# Patient Record
Sex: Female | Born: 1985
Health system: Southern US, Community
[De-identification: ages and names within clinical notes are randomized; demographics above are authoritative.]

## PROBLEM LIST (undated history)

## (undated) DIAGNOSIS — E039 Hypothyroidism, unspecified: Secondary | ICD-10-CM

## (undated) DIAGNOSIS — Z789 Other specified health status: Secondary | ICD-10-CM

---

## 2017-06-06 DIAGNOSIS — Z3A31 31 weeks gestation of pregnancy: Secondary | ICD-10-CM | POA: Diagnosis not present

## 2017-06-06 DIAGNOSIS — Z3A32 32 weeks gestation of pregnancy: Secondary | ICD-10-CM | POA: Diagnosis not present

## 2017-06-06 DIAGNOSIS — Z3403 Encounter for supervision of normal first pregnancy, third trimester: Secondary | ICD-10-CM | POA: Diagnosis not present

## 2017-06-06 DIAGNOSIS — Z3689 Encounter for other specified antenatal screening: Secondary | ICD-10-CM | POA: Diagnosis not present

## 2017-06-06 DIAGNOSIS — Z23 Encounter for immunization: Secondary | ICD-10-CM | POA: Diagnosis not present

## 2017-06-06 DIAGNOSIS — O99283 Endocrine, nutritional and metabolic diseases complicating pregnancy, third trimester: Secondary | ICD-10-CM | POA: Diagnosis not present

## 2017-06-13 DIAGNOSIS — O99283 Endocrine, nutritional and metabolic diseases complicating pregnancy, third trimester: Secondary | ICD-10-CM | POA: Diagnosis not present

## 2017-06-13 DIAGNOSIS — Z3A32 32 weeks gestation of pregnancy: Secondary | ICD-10-CM | POA: Diagnosis not present

## 2017-06-13 DIAGNOSIS — Z3689 Encounter for other specified antenatal screening: Secondary | ICD-10-CM | POA: Diagnosis not present

## 2017-06-13 LAB — OB RESULTS CONSOLE RPR: RPR: NONREACTIVE

## 2017-06-29 DIAGNOSIS — Z3A35 35 weeks gestation of pregnancy: Secondary | ICD-10-CM | POA: Diagnosis not present

## 2017-06-29 DIAGNOSIS — Z3685 Encounter for antenatal screening for Streptococcus B: Secondary | ICD-10-CM | POA: Diagnosis not present

## 2017-06-29 DIAGNOSIS — O99283 Endocrine, nutritional and metabolic diseases complicating pregnancy, third trimester: Secondary | ICD-10-CM | POA: Diagnosis not present

## 2017-06-29 LAB — OB RESULTS CONSOLE GBS: STREP GROUP B AG: NEGATIVE

## 2017-07-07 DIAGNOSIS — O99283 Endocrine, nutritional and metabolic diseases complicating pregnancy, third trimester: Secondary | ICD-10-CM | POA: Diagnosis not present

## 2017-07-07 DIAGNOSIS — Z3A36 36 weeks gestation of pregnancy: Secondary | ICD-10-CM | POA: Diagnosis not present

## 2017-07-13 DIAGNOSIS — O99283 Endocrine, nutritional and metabolic diseases complicating pregnancy, third trimester: Secondary | ICD-10-CM | POA: Diagnosis not present

## 2017-07-13 DIAGNOSIS — Z3A37 37 weeks gestation of pregnancy: Secondary | ICD-10-CM | POA: Diagnosis not present

## 2017-07-22 DIAGNOSIS — Z3A38 38 weeks gestation of pregnancy: Secondary | ICD-10-CM | POA: Diagnosis not present

## 2017-07-22 DIAGNOSIS — O99283 Endocrine, nutritional and metabolic diseases complicating pregnancy, third trimester: Secondary | ICD-10-CM | POA: Diagnosis not present

## 2017-07-27 ENCOUNTER — Inpatient Hospital Stay (HOSPITAL_COMMUNITY): Payer: BLUE CROSS/BLUE SHIELD | Admitting: Anesthesiology

## 2017-07-27 ENCOUNTER — Inpatient Hospital Stay (HOSPITAL_COMMUNITY)
Admission: AD | Admit: 2017-07-27 | Discharge: 2017-07-30 | DRG: 788 | Disposition: A | Discharge status: Home / Self Care | Payer: BLUE CROSS/BLUE SHIELD | Source: Ambulatory Visit | Attending: Obstetrics & Gynecology | Admitting: Obstetrics & Gynecology

## 2017-07-27 ENCOUNTER — Encounter (HOSPITAL_COMMUNITY)
Admission: AD | Disposition: A | Discharge status: Home / Self Care | Payer: Self-pay | Source: Ambulatory Visit | Attending: Obstetrics & Gynecology

## 2017-07-27 ENCOUNTER — Encounter (HOSPITAL_COMMUNITY): Payer: Self-pay | Admitting: *Deleted

## 2017-07-27 DIAGNOSIS — J069 Acute upper respiratory infection, unspecified: Secondary | ICD-10-CM | POA: Diagnosis present

## 2017-07-27 DIAGNOSIS — D241 Benign neoplasm of right breast: Secondary | ICD-10-CM | POA: Diagnosis not present

## 2017-07-27 DIAGNOSIS — O411230 Chorioamnionitis, third trimester, not applicable or unspecified: Secondary | ICD-10-CM | POA: Diagnosis not present

## 2017-07-27 DIAGNOSIS — O9952 Diseases of the respiratory system complicating childbirth: Secondary | ICD-10-CM | POA: Diagnosis present

## 2017-07-27 DIAGNOSIS — Z3A39 39 weeks gestation of pregnancy: Secondary | ICD-10-CM | POA: Diagnosis not present

## 2017-07-27 DIAGNOSIS — O4103X0 Oligohydramnios, third trimester, not applicable or unspecified: Secondary | ICD-10-CM | POA: Diagnosis not present

## 2017-07-27 DIAGNOSIS — E669 Obesity, unspecified: Secondary | ICD-10-CM | POA: Diagnosis present

## 2017-07-27 DIAGNOSIS — O99284 Endocrine, nutritional and metabolic diseases complicating childbirth: Secondary | ICD-10-CM | POA: Diagnosis present

## 2017-07-27 DIAGNOSIS — D649 Anemia, unspecified: Secondary | ICD-10-CM | POA: Diagnosis present

## 2017-07-27 DIAGNOSIS — O99214 Obesity complicating childbirth: Secondary | ICD-10-CM | POA: Diagnosis present

## 2017-07-27 DIAGNOSIS — E039 Hypothyroidism, unspecified: Secondary | ICD-10-CM | POA: Diagnosis present

## 2017-07-27 DIAGNOSIS — O9902 Anemia complicating childbirth: Secondary | ICD-10-CM | POA: Diagnosis not present

## 2017-07-27 DIAGNOSIS — O9989 Other specified diseases and conditions complicating pregnancy, childbirth and the puerperium: Secondary | ICD-10-CM | POA: Diagnosis not present

## 2017-07-27 HISTORY — DX: Hypothyroidism, unspecified: E03.9

## 2017-07-27 HISTORY — DX: Other specified health status: Z78.9

## 2017-07-27 LAB — CBC
HEMATOCRIT: 36.5 % (ref 36.0–46.0)
HEMOGLOBIN: 12.2 g/dL (ref 12.0–15.0)
MCH: 28.6 pg (ref 26.0–34.0)
MCHC: 33.4 g/dL (ref 30.0–36.0)
MCV: 85.7 fL (ref 78.0–100.0)
Platelets: 181 10*3/uL (ref 150–400)
RBC: 4.26 MIL/uL (ref 3.87–5.11)
RDW: 15.2 % (ref 11.5–15.5)
WBC: 11 10*3/uL — AB (ref 4.0–10.5)

## 2017-07-27 LAB — OB RESULTS CONSOLE ABO/RH: RH TYPE: POSITIVE

## 2017-07-27 LAB — OB RESULTS CONSOLE GC/CHLAMYDIA
Chlamydia: NEGATIVE
Gonorrhea: NEGATIVE

## 2017-07-27 LAB — ABO/RH: ABO/RH(D): O POS

## 2017-07-27 LAB — TYPE AND SCREEN
ABO/RH(D): O POS
ANTIBODY SCREEN: NEGATIVE

## 2017-07-27 LAB — OB RESULTS CONSOLE HIV ANTIBODY (ROUTINE TESTING): HIV: NONREACTIVE

## 2017-07-27 LAB — OB RESULTS CONSOLE HEPATITIS B SURFACE ANTIGEN: Hepatitis B Surface Ag: NEGATIVE

## 2017-07-27 LAB — OB RESULTS CONSOLE RUBELLA ANTIBODY, IGM: RUBELLA: IMMUNE

## 2017-07-27 SURGERY — Surgical Case
Anesthesia: Spinal

## 2017-07-27 MED ORDER — BUPIVACAINE IN DEXTROSE 0.75-8.25 % IT SOLN
INTRATHECAL | Status: DC | PRN
  Administered 2017-07-27: 1.6 mL via INTRATHECAL

## 2017-07-27 MED ORDER — KETOROLAC TROMETHAMINE 30 MG/ML IJ SOLN: 30.0000 mg | Freq: Four times a day (QID) | INTRAMUSCULAR | Status: AC | PRN

## 2017-07-27 MED ORDER — NALBUPHINE HCL 10 MG/ML IJ SOLN
5.0000 mg | Freq: Once | INTRAMUSCULAR | Status: DC | PRN
Start: 2017-07-27 — End: 2017-07-30

## 2017-07-27 MED ORDER — KETOROLAC TROMETHAMINE 30 MG/ML IJ SOLN
30.0000 mg | Freq: Four times a day (QID) | INTRAMUSCULAR | Status: AC | PRN
  Administered 2017-07-27: 30 mg via INTRAMUSCULAR

## 2017-07-27 MED ORDER — OXYTOCIN 10 UNIT/ML IJ SOLN
INTRAVENOUS | Status: DC | PRN
  Administered 2017-07-27: 40 [IU] via INTRAVENOUS

## 2017-07-27 MED ORDER — SOD CITRATE-CITRIC ACID 500-334 MG/5ML PO SOLN
30.0000 mL | ORAL | Status: DC | PRN
  Administered 2017-07-27: 30 mL via ORAL
  Filled 2017-07-27: qty 15

## 2017-07-27 MED ORDER — OXYCODONE HCL 5 MG/5ML PO SOLN: 5.0000 mg | Freq: Once | ORAL | Status: DC | PRN

## 2017-07-27 MED ORDER — MORPHINE SULFATE (PF) 0.5 MG/ML IJ SOLN
INTRAMUSCULAR | Status: DC | PRN
  Administered 2017-07-27: .2 mg via INTRATHECAL

## 2017-07-27 MED ORDER — DEXAMETHASONE SODIUM PHOSPHATE 10 MG/ML IJ SOLN
INTRAMUSCULAR | Status: DC | PRN
  Administered 2017-07-27: 10 mg via INTRAVENOUS

## 2017-07-27 MED ORDER — OXYTOCIN 40 UNITS IN LACTATED RINGERS INFUSION - SIMPLE MED: 2.5000 [IU]/h | INTRAVENOUS | Status: DC

## 2017-07-27 MED ORDER — LACTATED RINGERS IV BOLUS (SEPSIS): 500.0000 mL | Freq: Once | INTRAVENOUS | Status: DC

## 2017-07-27 MED ORDER — LACTATED RINGERS IV SOLN
INTRAVENOUS | Status: DC
  Administered 2017-07-28: via INTRAVENOUS

## 2017-07-27 MED ORDER — OXYCODONE HCL 5 MG PO TABS
5.0000 mg | ORAL_TABLET | Freq: Once | ORAL | Status: DC | PRN
Start: 2017-07-27 — End: 2017-07-27

## 2017-07-27 MED ORDER — ONDANSETRON HCL 4 MG/2ML IJ SOLN
4.0000 mg | Freq: Four times a day (QID) | INTRAMUSCULAR | Status: DC | PRN
  Administered 2017-07-27: 4 mg via INTRAVENOUS

## 2017-07-27 MED ORDER — DIPHENHYDRAMINE HCL 25 MG PO CAPS
25.0000 mg | ORAL_CAPSULE | ORAL | Status: DC | PRN
  Administered 2017-07-28 (×2): 25 mg via ORAL
  Filled 2017-07-27 (×2): qty 1

## 2017-07-27 MED ORDER — DEXAMETHASONE SODIUM PHOSPHATE 10 MG/ML IJ SOLN
INTRAMUSCULAR | Status: AC
  Filled 2017-07-27: qty 1

## 2017-07-27 MED ORDER — ACETAMINOPHEN 325 MG PO TABS
650.0000 mg | ORAL_TABLET | ORAL | Status: DC | PRN
  Administered 2017-07-29: 650 mg via ORAL
  Filled 2017-07-27: qty 2

## 2017-07-27 MED ORDER — BROMPHENIRAMINE-PSEUDOEPH 1-15 MG/5ML PO ELIX: 5.0000 mL | ORAL_SOLUTION | Freq: Three times a day (TID) | ORAL | Status: DC | PRN

## 2017-07-27 MED ORDER — NALOXONE HCL 0.4 MG/ML IJ SOLN: 0.4000 mg | INTRAMUSCULAR | Status: DC | PRN

## 2017-07-27 MED ORDER — PHENYLEPHRINE 8 MG IN D5W 100 ML (0.08MG/ML) PREMIX OPTIME
INJECTION | INTRAVENOUS | Status: AC
  Filled 2017-07-27: qty 100

## 2017-07-27 MED ORDER — DIBUCAINE 1 % RE OINT: 1.0000 "application " | TOPICAL_OINTMENT | RECTAL | Status: DC | PRN

## 2017-07-27 MED ORDER — PHENYLEPHRINE 8 MG IN D5W 100 ML (0.08MG/ML) PREMIX OPTIME
INJECTION | INTRAVENOUS | Status: DC | PRN
  Administered 2017-07-27: 50 ug/min via INTRAVENOUS

## 2017-07-27 MED ORDER — MEPERIDINE HCL 25 MG/ML IJ SOLN: 6.2500 mg | INTRAMUSCULAR | Status: DC | PRN

## 2017-07-27 MED ORDER — OXYCODONE-ACETAMINOPHEN 5-325 MG PO TABS: 2.0000 | ORAL_TABLET | ORAL | Status: DC | PRN

## 2017-07-27 MED ORDER — WITCH HAZEL-GLYCERIN EX PADS: 1.0000 "application " | MEDICATED_PAD | CUTANEOUS | Status: DC | PRN

## 2017-07-27 MED ORDER — PRENATAL MULTIVITAMIN CH
1.0000 | ORAL_TABLET | Freq: Every day | ORAL | Status: DC
  Administered 2017-07-28 – 2017-07-30 (×3): 1 via ORAL
  Filled 2017-07-27 (×3): qty 1

## 2017-07-27 MED ORDER — SIMETHICONE 80 MG PO CHEW
80.0000 mg | CHEWABLE_TABLET | Freq: Three times a day (TID) | ORAL | Status: DC
  Administered 2017-07-28 – 2017-07-30 (×8): 80 mg via ORAL
  Filled 2017-07-27 (×7): qty 1

## 2017-07-27 MED ORDER — SCOPOLAMINE 1 MG/3DAYS TD PT72
MEDICATED_PATCH | TRANSDERMAL | Status: AC
  Filled 2017-07-27: qty 1

## 2017-07-27 MED ORDER — ONDANSETRON HCL 4 MG/2ML IJ SOLN: 4.0000 mg | Freq: Three times a day (TID) | INTRAMUSCULAR | Status: DC | PRN

## 2017-07-27 MED ORDER — MENTHOL 3 MG MT LOZG: 1.0000 | LOZENGE | OROMUCOSAL | Status: DC | PRN

## 2017-07-27 MED ORDER — FENTANYL CITRATE (PF) 100 MCG/2ML IJ SOLN
INTRAMUSCULAR | Status: DC | PRN
  Administered 2017-07-27: 10 ug via INTRATHECAL

## 2017-07-27 MED ORDER — NALBUPHINE HCL 10 MG/ML IJ SOLN: 5.0000 mg | Freq: Once | INTRAMUSCULAR | Status: DC | PRN

## 2017-07-27 MED ORDER — DIPHENHYDRAMINE HCL 50 MG/ML IJ SOLN: 12.5000 mg | INTRAMUSCULAR | Status: DC | PRN

## 2017-07-27 MED ORDER — AMOXICILLIN-POT CLAVULANATE 875-125 MG PO TABS
1.0000 | ORAL_TABLET | Freq: Two times a day (BID) | ORAL | Status: DC
  Administered 2017-07-28 – 2017-07-30 (×5): 1 via ORAL
  Filled 2017-07-27 (×8): qty 1

## 2017-07-27 MED ORDER — OXYCODONE HCL 5 MG PO TABS
5.0000 mg | ORAL_TABLET | ORAL | Status: DC | PRN
  Administered 2017-07-28 – 2017-07-29 (×4): 5 mg via ORAL
  Filled 2017-07-27 (×4): qty 1

## 2017-07-27 MED ORDER — NALBUPHINE HCL 10 MG/ML IJ SOLN: 5.0000 mg | INTRAMUSCULAR | Status: DC | PRN

## 2017-07-27 MED ORDER — ONDANSETRON HCL 4 MG/2ML IJ SOLN
INTRAMUSCULAR | Status: AC
  Filled 2017-07-27: qty 2

## 2017-07-27 MED ORDER — ACETAMINOPHEN 500 MG PO TABS
ORAL_TABLET | ORAL | Status: AC
  Administered 2017-07-27: 1000 mg via ORAL
  Filled 2017-07-27: qty 2

## 2017-07-27 MED ORDER — SENNOSIDES-DOCUSATE SODIUM 8.6-50 MG PO TABS
2.0000 | ORAL_TABLET | ORAL | Status: DC
  Administered 2017-07-29 (×2): 2 via ORAL
  Filled 2017-07-27 (×2): qty 2

## 2017-07-27 MED ORDER — SODIUM CHLORIDE 0.9% FLUSH: 3.0000 mL | INTRAVENOUS | Status: DC | PRN

## 2017-07-27 MED ORDER — OXYTOCIN 10 UNIT/ML IJ SOLN
INTRAMUSCULAR | Status: AC
  Filled 2017-07-27: qty 4

## 2017-07-27 MED ORDER — OXYTOCIN 40 UNITS IN LACTATED RINGERS INFUSION - SIMPLE MED: 2.5000 [IU]/h | INTRAVENOUS | Status: AC

## 2017-07-27 MED ORDER — FLEET ENEMA 7-19 GM/118ML RE ENEM: 1.0000 | ENEMA | Freq: Every day | RECTAL | Status: DC | PRN

## 2017-07-27 MED ORDER — DIPHENHYDRAMINE HCL 25 MG PO CAPS: 25.0000 mg | ORAL_CAPSULE | Freq: Four times a day (QID) | ORAL | Status: DC | PRN

## 2017-07-27 MED ORDER — LACTATED RINGERS IV SOLN
INTRAVENOUS | Status: DC
  Administered 2017-07-27 (×2): via INTRAVENOUS

## 2017-07-27 MED ORDER — PROMETHAZINE HCL 25 MG/ML IJ SOLN: 6.2500 mg | INTRAMUSCULAR | Status: DC | PRN

## 2017-07-27 MED ORDER — NALBUPHINE HCL 10 MG/ML IJ SOLN
5.0000 mg | INTRAMUSCULAR | Status: DC | PRN
  Administered 2017-07-28 (×3): 5 mg via INTRAVENOUS
  Filled 2017-07-27 (×3): qty 1

## 2017-07-27 MED ORDER — CEFAZOLIN SODIUM-DEXTROSE 2-4 GM/100ML-% IV SOLN
INTRAVENOUS | Status: AC
  Filled 2017-07-27: qty 100

## 2017-07-27 MED ORDER — FAMOTIDINE 20 MG PO TABS: 20.0000 mg | ORAL_TABLET | Freq: Every day | ORAL | Status: DC | PRN

## 2017-07-27 MED ORDER — CEFAZOLIN SODIUM-DEXTROSE 2-4 GM/100ML-% IV SOLN
2.0000 g | INTRAVENOUS | Status: AC
  Administered 2017-07-27: 2 g via INTRAVENOUS

## 2017-07-27 MED ORDER — SCOPOLAMINE 1 MG/3DAYS TD PT72: 1.0000 | MEDICATED_PATCH | Freq: Once | TRANSDERMAL | Status: DC

## 2017-07-27 MED ORDER — LACTATED RINGERS IV SOLN
INTRAVENOUS | Status: DC | PRN
  Administered 2017-07-27: 21:00:00 via INTRAVENOUS

## 2017-07-27 MED ORDER — SCOPOLAMINE 1 MG/3DAYS TD PT72
MEDICATED_PATCH | TRANSDERMAL | Status: DC | PRN
  Administered 2017-07-27: 1 via TRANSDERMAL

## 2017-07-27 MED ORDER — ACETAMINOPHEN 500 MG PO TABS
1000.0000 mg | ORAL_TABLET | Freq: Four times a day (QID) | ORAL | Status: AC
  Administered 2017-07-27 – 2017-07-28 (×3): 1000 mg via ORAL
  Filled 2017-07-27 (×2): qty 2

## 2017-07-27 MED ORDER — SIMETHICONE 80 MG PO CHEW: 80.0000 mg | CHEWABLE_TABLET | ORAL | Status: DC | PRN

## 2017-07-27 MED ORDER — ZOLPIDEM TARTRATE 5 MG PO TABS: 5.0000 mg | ORAL_TABLET | Freq: Every evening | ORAL | Status: DC | PRN

## 2017-07-27 MED ORDER — FENTANYL CITRATE (PF) 100 MCG/2ML IJ SOLN
INTRAMUSCULAR | Status: AC
  Filled 2017-07-27: qty 2

## 2017-07-27 MED ORDER — LEVOTHYROXINE SODIUM 50 MCG PO TABS
50.0000 ug | ORAL_TABLET | Freq: Every day | ORAL | Status: DC
  Administered 2017-07-28 – 2017-07-30 (×3): 50 ug via ORAL
  Filled 2017-07-27 (×4): qty 1

## 2017-07-27 MED ORDER — OXYTOCIN BOLUS FROM INFUSION: 500.0000 mL | Freq: Once | INTRAVENOUS | Status: DC

## 2017-07-27 MED ORDER — SODIUM CHLORIDE 0.9 % IR SOLN
Status: DC | PRN
  Administered 2017-07-27: 1

## 2017-07-27 MED ORDER — MORPHINE SULFATE (PF) 0.5 MG/ML IJ SOLN
INTRAMUSCULAR | Status: AC
  Filled 2017-07-27: qty 10

## 2017-07-27 MED ORDER — MISOPROSTOL 25 MCG QUARTER TABLET
25.0000 ug | ORAL_TABLET | ORAL | Status: DC | PRN
  Administered 2017-07-27 (×2): 25 ug via VAGINAL
  Filled 2017-07-27 (×2): qty 1

## 2017-07-27 MED ORDER — MEPERIDINE HCL 25 MG/ML IJ SOLN
INTRAMUSCULAR | Status: AC
Start: 2017-07-27 — End: 2017-07-27
  Filled 2017-07-27: qty 1

## 2017-07-27 MED ORDER — LIDOCAINE HCL (PF) 1 % IJ SOLN: 30.0000 mL | INTRAMUSCULAR | Status: DC | PRN

## 2017-07-27 MED ORDER — LACTATED RINGERS IV SOLN
500.0000 mL | INTRAVENOUS | Status: DC | PRN
  Administered 2017-07-27: 500 mL via INTRAVENOUS

## 2017-07-27 MED ORDER — TETANUS-DIPHTH-ACELL PERTUSSIS 5-2.5-18.5 LF-MCG/0.5 IM SUSP: 0.5000 mL | Freq: Once | INTRAMUSCULAR | Status: DC

## 2017-07-27 MED ORDER — GUAIFENESIN-DM 100-10 MG/5ML PO SYRP
10.0000 mL | ORAL_SOLUTION | Freq: Four times a day (QID) | ORAL | Status: DC | PRN
  Administered 2017-07-27 – 2017-07-29 (×4): 10 mL via ORAL
  Filled 2017-07-27 (×7): qty 10

## 2017-07-27 MED ORDER — OXYCODONE-ACETAMINOPHEN 5-325 MG PO TABS: 1.0000 | ORAL_TABLET | ORAL | Status: DC | PRN

## 2017-07-27 MED ORDER — TERBUTALINE SULFATE 1 MG/ML IJ SOLN: 0.2500 mg | Freq: Once | INTRAMUSCULAR | Status: DC | PRN

## 2017-07-27 MED ORDER — KETOROLAC TROMETHAMINE 30 MG/ML IJ SOLN
INTRAMUSCULAR | Status: AC
  Administered 2017-07-27: 30 mg via INTRAMUSCULAR
  Filled 2017-07-27: qty 1

## 2017-07-27 MED ORDER — MEPERIDINE HCL 25 MG/ML IJ SOLN
INTRAMUSCULAR | Status: DC | PRN
  Administered 2017-07-27 (×2): 12.5 mg via INTRAVENOUS

## 2017-07-27 MED ORDER — COCONUT OIL OIL: 1.0000 "application " | TOPICAL_OIL | Status: DC | PRN

## 2017-07-27 MED ORDER — IBUPROFEN 600 MG PO TABS
600.0000 mg | ORAL_TABLET | Freq: Four times a day (QID) | ORAL | Status: DC
  Administered 2017-07-28 – 2017-07-30 (×9): 600 mg via ORAL
  Filled 2017-07-27 (×9): qty 1

## 2017-07-27 MED ORDER — FENTANYL CITRATE (PF) 100 MCG/2ML IJ SOLN: 25.0000 ug | INTRAMUSCULAR | Status: DC | PRN

## 2017-07-27 MED ORDER — NALOXONE HCL 4 MG/10ML IJ SOLN
1.0000 ug/kg/h | INTRAVENOUS | Status: DC | PRN
  Filled 2017-07-27: qty 5

## 2017-07-27 MED ORDER — FERROUS SULFATE 325 (65 FE) MG PO TABS
325.0000 mg | ORAL_TABLET | Freq: Every day | ORAL | Status: DC
  Administered 2017-07-28 – 2017-07-30 (×3): 325 mg via ORAL
  Filled 2017-07-27 (×3): qty 1

## 2017-07-27 MED ORDER — ACETAMINOPHEN 325 MG PO TABS: 650.0000 mg | ORAL_TABLET | ORAL | Status: DC | PRN

## 2017-07-27 MED ORDER — SIMETHICONE 80 MG PO CHEW
80.0000 mg | CHEWABLE_TABLET | ORAL | Status: DC
  Administered 2017-07-29 (×2): 80 mg via ORAL
  Filled 2017-07-27 (×2): qty 1

## 2017-07-27 SURGICAL SUPPLY — 34 items
BENZOIN TINCTURE PRP APPL 2/3 (GAUZE/BANDAGES/DRESSINGS) ×3 IMPLANT
CHLORAPREP W/TINT 26ML (MISCELLANEOUS) ×3 IMPLANT
CLAMP CORD UMBIL (MISCELLANEOUS) IMPLANT
CLOSURE WOUND 1/2 X4 (GAUZE/BANDAGES/DRESSINGS) ×1
CLOTH BEACON ORANGE TIMEOUT ST (SAFETY) ×3 IMPLANT
DRSG OPSITE POSTOP 4X10 (GAUZE/BANDAGES/DRESSINGS) ×3 IMPLANT
ELECT REM PT RETURN 9FT ADLT (ELECTROSURGICAL) ×3
ELECTRODE REM PT RTRN 9FT ADLT (ELECTROSURGICAL) ×1 IMPLANT
EXTRACTOR VACUUM KIWI (MISCELLANEOUS) IMPLANT
EXTRACTOR VACUUM M CUP 4 TUBE (SUCTIONS) IMPLANT
EXTRACTOR VACUUM M CUP 4' TUBE (SUCTIONS)
GLOVE BIO SURGEON STRL SZ7 (GLOVE) ×3 IMPLANT
GLOVE BIOGEL PI IND STRL 7.0 (GLOVE) ×2 IMPLANT
GLOVE BIOGEL PI INDICATOR 7.0 (GLOVE) ×4
GOWN STRL REUS W/TWL LRG LVL3 (GOWN DISPOSABLE) ×6 IMPLANT
KIT ABG SYR 3ML LUER SLIP (SYRINGE) IMPLANT
NEEDLE HYPO 25X5/8 SAFETYGLIDE (NEEDLE) IMPLANT
NS IRRIG 1000ML POUR BTL (IV SOLUTION) ×3 IMPLANT
PACK C SECTION WH (CUSTOM PROCEDURE TRAY) ×3 IMPLANT
PAD OB MATERNITY 4.3X12.25 (PERSONAL CARE ITEMS) ×3 IMPLANT
RTRCTR C-SECT PINK 25CM LRG (MISCELLANEOUS) IMPLANT
STRIP CLOSURE SKIN 1/2X4 (GAUZE/BANDAGES/DRESSINGS) ×2 IMPLANT
SUT MON AB-0 CT1 36 (SUTURE) ×6 IMPLANT
SUT PLAIN 0 NONE (SUTURE) IMPLANT
SUT PLAIN 2 0 (SUTURE)
SUT PLAIN ABS 2-0 CT1 27XMFL (SUTURE) IMPLANT
SUT VIC AB 0 CT1 27 (SUTURE) ×4
SUT VIC AB 0 CT1 27XBRD ANBCTR (SUTURE) ×2 IMPLANT
SUT VIC AB 2-0 CT1 27 (SUTURE) ×2
SUT VIC AB 2-0 CT1 TAPERPNT 27 (SUTURE) ×1 IMPLANT
SUT VIC AB 4-0 KS 27 (SUTURE) ×3 IMPLANT
SUT VICRYL 0 TIES 12 18 (SUTURE) IMPLANT
TOWEL OR 17X24 6PK STRL BLUE (TOWEL DISPOSABLE) ×3 IMPLANT
TRAY FOLEY BAG SILVER LF 14FR (SET/KITS/TRAYS/PACK) IMPLANT

## 2017-07-27 NOTE — Anesthesia Pain Management Evaluation Note (Signed)
  CRNA Pain Management Visit Note  Patient: Lydia Moss, 32 y.o., female  "Hello I am a member of the anesthesia team at [facility]. We have an anesthesia team available at all times to provide care throughout the hospital, including epidural management and anesthesia for C-section. I don't know your plan for the delivery whether it a natural birth, water birth, IV sedation, nitrous supplementation, doula or epidural, but we want to meet your pain goals."   1.Was your pain managed to your expectations on prior hospitalizations?   Yes   2.What is your expectation for pain management during this hospitalization?     Epidural, IV pain meds and Nitrous Oxide  3.How can we help you reach that goal? Be available  Record the patient's initial score and the patient's pain goal.   Pain: 4  Pain Goal: 6 [facility] wants you to be able to say your pain was always managed very well.  [facility] 07/27/2017

## 2017-07-27 NOTE — Anesthesia Postprocedure Evaluation (Signed)
Anesthesia Post Note  Patient: Lydia Moss  Procedure(s) Performed: CESAREAN SECTION (N/A )     Patient location during evaluation: PACU Anesthesia Type: Spinal Level of consciousness: awake and alert Pain management: pain level controlled Vital Signs Assessment: post-procedure vital signs reviewed and stable Respiratory status: spontaneous breathing and respiratory function stable Cardiovascular status: blood pressure returned to baseline and stable Postop Assessment: spinal receding and no apparent nausea or vomiting Anesthetic complications: no    Last Vitals:  Vitals:   07/27/17 2215 07/27/17 2230  BP: 110/71 117/60  Pulse: 71 65  Resp: 18 20  Temp:    SpO2: 100% 100%    Last Pain:  Vitals:   07/27/17 2145  TempSrc:   PainSc: (P) 0-No pain   Pain Goal:                 Audry Pili

## 2017-07-27 NOTE — Progress Notes (Signed)
Patient ID: Lydia Moss, female   DOB: 1986-06-30, 32 y.o.   MRN: [id number] Repetitive late decels, cx closed, remote from delivery, failed resuscitation in utero, proceed with C-section now,   Risks/complications of surgery reviewed incl infection, bleeding, damage to internal organs including bladder, bowels, ureters, blood vessels, other risks from anesthesia, VTE and delayed complications of any surgery, complications in future surgery reviewed. Also discussed neonatal complications incl difficult delivery, laceration, vacuum assistance, TTN etc. Pt understands and agrees, all concerns addressed.

## 2017-07-27 NOTE — Progress Notes (Signed)
Lydia Moss is a 32 y.o. G1P0 at [redacted]w[redacted]d by ultrasound admitted for induction of labor due to Low amniotic fluid.Marland KitchenAFI 2 cm, BPP 6/8 (was 4/8 but got 2 points for AFI after baby voided during sono)  Subjective: Feels occ contraction pain, nothing significant. S/p Cytotec 25 mcg #2 now.   Objective: BP 131/79   Pulse 89   Temp 98.4 F (36.9 C) (Oral)   Resp 18   Ht 5\' 4"  (1.626 m)   Wt 202 lb 9.6 oz (91.9 kg)   BMI 34.78 kg/m  FHR- 150s/ minimal to no variability for long periods followed but short period of minimal to moderate variability/ few subtle late decels and no accels since admission.  Toco irreg  SVE: Cx closed/ soft/ stn -4 at inlet. Remote from active labor   Assessment / Plan: Induction of labor due to Oligohydramnios, BPP 6/8,  S/p cytotec, 2nd dose now.  FHT now cat II again. Plan 500 cc LR bolus and O2 by mask 30 min and reassess if improved, if not better or worse again, considering she is remote from delivery and G1, proceed with c-section delivery.   Pt counseled, worried about her baby, reassured being closely monitored and reviewed plan for immediate c-section if indicated. Surgery reviewed including risks/ complications. Her sister was in room as well as her mother, all understand and agree.   Anesthesia aware.   Lydia Moss 07/27/2017, 7:04 PM

## 2017-07-27 NOTE — Transfer of Care (Signed)
Immediate Anesthesia Transfer of Care Note  Patient: Lydia Moss  Procedure(s) Performed: CESAREAN SECTION (N/A )  Patient Location: PACU  Anesthesia Type:Spinal  Level of Consciousness: awake, alert , oriented and patient cooperative  Airway & Oxygen Therapy: Patient Spontanous Breathing  Post-op Assessment: Report given to RN and Post -op Vital signs reviewed and stable  Post vital signs: Reviewed and stable  Last Vitals:  Vitals:   07/27/17 1840 07/27/17 1947  BP: 131/79 117/70  Pulse: 89 82  Resp: 18 18  Temp:  36.7 C    Last Pain:  Vitals:   07/27/17 1947  TempSrc: Oral  PainSc:          Complications: No apparent anesthesia complications

## 2017-07-27 NOTE — Progress Notes (Signed)
Lydia Moss is a 32 y.o. G1P0 at [redacted]w[redacted]d by ultrasound admitted for induction of labor due to Low amniotic fluid..  Subjective: No complaints/ S/p Cytotec 25 mcg at   Objective: Ht 5\' 4"  (1.626 m)   Wt 202 lb 9.6 oz (91.9 kg)   BMI 34.78 kg/m   FHR: 150s bpm, variability: moderate,  accelerations:  Present,  decelerations:  Absent - now category I UC:   irregular [id number]  Labs: Lab Results  Component Value Date   WBC 11.0 (H) 07/27/2017   HGB 12.2 07/27/2017   HCT 36.5 07/27/2017   MCV 85.7 07/27/2017   PLT 181 07/27/2017    Assessment / Plan: Induction of labor due to Oligohydramnios, BPP 6/8,  S/p cytotec,  Start pitocin after 4 hrs per protocol  FHT improved to cat I now, monitor closely  I/D:  n/a Anticipated MOD: plan for  NSVD if fetal tolerance  Elveria Royals 07/27/2017, 6:15 PM

## 2017-07-27 NOTE — Progress Notes (Signed)
Lab results entered per MD pre natal records.

## 2017-07-27 NOTE — Anesthesia Procedure Notes (Signed)
Spinal  Patient location during procedure: OR Start time: 07/27/2017 8:11 PM End time: 07/27/2017 8:14 PM Staffing Anesthesiologist: Audry Pili, MD Performed: anesthesiologist  Preanesthetic Checklist Completed: patient identified, surgical consent, pre-op evaluation, timeout performed, IV checked, risks and benefits discussed and monitors and equipment checked Spinal Block Patient position: sitting Prep: DuraPrep Patient monitoring: heart rate, cardiac monitor, continuous pulse ox and blood pressure Approach: midline Location: L3-4 Injection technique: single-shot Needle Needle type: Pencan  Needle gauge: 24 G Additional Notes Functioning IV was confirmed and monitors were applied. Sterile prep and drape, including hand hygiene, mask, and sterile gloves were used. The patient was positioned and the spine was prepped. The skin was anesthetized with lidocaine. Free flow of clear CSF was obtained prior to injecting local anesthetic into the CSF. The spinal needle aspirated freely following injection. The needle was carefully withdrawn. The patient tolerated the procedure well. Consent was obtained prior to the procedure with all questions answered and concerns addressed. Risks including, but not limited to, bleeding, infection, nerve damage, paralysis, failed block, inadequate analgesia, allergic reaction, high spinal, itching, and headache were discussed and the patient wished to proceed.  Renold Don, MD

## 2017-07-27 NOTE — H&P (Signed)
Lydia Moss is a 32 y.o. female presenting for  IOL for oligohydramnios- AFI 2.6 cm and pocket <2 cm and BPP 6/8   Pt transferred to [facility] from [place] at 33 wks. She had good PNCare before transferring and since.  Hypothyroid, normal labs. Synthroid at 50 mcg, nl TSH/ Free T4 in 3rd trim here.  Growth sono 31.5 wks,- AGA. 4'13" 81% AC 44%. Vtx normal.  NST reactive, wkly since 36 wks. AFI low since 36 wks but down to 5.5 at 38 wks and to 2.6 cm today with BPP 6/8 (0 for FMs).   Maternal anemia Right breast lump, likely fibroadenoma, f/up PP Maternal obesity   OB History    Gravida Para Term Preterm AB Living   1             SAB TAB Ectopic Multiple Live Births                 Past Medical History:  Diagnosis Date  . Hypothyroidism   . Medical history non-contributory    History reviewed. No pertinent surgical history. Family History: family history is not on file. Social History:  reports that  has never smoked. she has never used smokeless tobacco. She reports that she does not drink alcohol or use drugs.     Maternal Diabetes: No Genetic Screening: Declined- don't have records from [place] about aneuploidy screen or NTD screen. Maternal Ultrasounds/Referrals: Normal Fetal Ultrasounds or other Referrals:  None Maternal Substance Abuse:  No Significant Maternal Medications:  Meds include: Syntroid Significant Maternal Lab Results:  Lab values include: Group B Strep negative Other Comments:  None  ROS History Dilation: Closed Effacement (%): Thick Station: Ballotable Exam by:: Mabeline Caras, RN Height 5\' 4"  (1.626 m), weight 202 lb 9.6 oz (91.9 kg). Exam Physical Exam  Ht 5\' 4"  (1.626 m)   Wt 202 lb 9.6 oz (91.9 kg)   BMI 34.78 kg/m   Physical exam:  A&O x 3, no acute distress. Pleasant HEENT neg, no thyromegaly Lungs CTA bilat CV RRR, S1S2 normal Abdo soft, non tender, non acute Extr no edema/ tenderness Pelvic closed/ long/ stn -5/  vx.  FHT 140s/ no  accels/ no decels/ minimal variability/ cat II, continue to watch closely. Toco none   Prenatal labs: ABO, Rh: --/--/O POS (01/16 1357) Antibody: NEG (01/16 1357) Rubella: Immune (01/16 0000) RPR: Nonreactive (12/03 0000)  HBsAg: Negative (01/16 0000)  HIV: Non-reactive (01/16 0000)  GBS: Negative (12/19 0000)  Glucola normal (did here after transferring at 32 wks) TSH/ free T4 normal   Assessment/Plan: 32 yo G1 at 39 wks for IOL for oligohydramnios. FHT at present in category II, close monitoring.  Cytotec IOL, watch closely [place] 07/27/2017, 3:09 PM

## 2017-07-27 NOTE — Op Note (Signed)
Procedure Note 07/27/2017  Lydia Moss   Procedure: Primary Low Transverse Cesarean section                    (two layer closure of hysterotomy)  Indications: 39 wks, labor induction for oligohydramnios BPP 6/8. Undilated cervix, fetal intolerance to contraction, repetitive late decelerations, category III FHT, remote from delivery.   Pre-operative Diagnosis: Fetal intolerance to contractions, category III FHT.                                              Post-operative Diagnosis: Same   Surgeon: Azucena Fallen, MD   Assistants: Derrell Lolling, CNM   Anesthesia: Spinal   Procedure Details:  The patient was admitted for labor induction for severe oligohydramnios with AFI 2 cm and BPP 6/8. Since admission FHT went from category I to category II but was reassuring enough to attempt labor induction with Cytotec. FHT went to category II when fluid hydration, O2 by mask given. With contractions repetitive late decels were noted and cervix was not dilated and station was at -4. Considering remote from delivery and category III FHT, she was advised to have C-section delivery. The risks, benefits, complications, treatment options, and expected outcomes were discussed with the patient. The patient concurred with the proposed plan, giving informed consent. She was moved to the Operating Room, identified as Lydia Moss and the procedure verified as C-Section Delivery. A Time Out was held and the above information confirmed.  After spinal anesthesia was given and was adequate, she draped and prepped in the usual sterile manner and foley catheter was placed.  A Pfannenstiel Incision was made and carried down through the subcutaneous tissue to the fascia. Fascial incision was made and extended transversely. The fascia was separated from the underlying rectus tissue superiorly and inferiorly. The peritoneum was identified and entered. Peritoneal incision was extended longitudinally. Alexis retractor placed. The  utero-vesical peritoneal reflection was incised transversely and the bladder flap was bluntly freed from the lower uterine segment. A low transverse uterine incision was made. Thick pea soup meconium noted in minimal amniotic fluid. Head was presenting at the incision. At head delivery nuchal cord x3 noted and the cord was very thin. Cord loops were was released over the head after head delivery and the rest of the baby delivered smoothly, baby BOY birth time 20.36 hours. Delayed cord clamping done at 1 minute and baby handed to NICU team. Cord gas collection was done from vein as the arteries were collapsed completely due to very thin cord. Cord blood obtained and sent. Apgar scores of 8 at one minute and 9 at five minutes. The placenta was removed Intact and appeared abnormal - thick meconium stained and calcified. The uterine outline, tubes and ovaries appeared normal. The uterine incision was closed with running locked sutures of 0Monocryl followed by a second imbricating layer with same suture. Hemostasis was observed. Alexis retractor removed. Peritoneal closure done with 2-0 Vicryl. Muscles approximated in lower part at Pyramidalis level. The fascia was then reapproximated with running sutures of 0Vicryl. The subcuticular closure was performed using 2-0plain gut. The skin was closed with 4-0Vicryl. Steristrips/ honeycomb/ pressure dressings placed.   Instrument, sponge, and needle counts were correct prior the abdominal closure and were correct at the conclusion of the case.   Findings: Thick pea soup meconium (small amount only), Nuchal  cord x 3 loops tight, placenta calcified and meconium stained. FEMALE infant, birth time 20.36 hours on 07/27/2017, Apgars 8 and 9. Cord was thin, only venous gas was possible- 7.3    Estimated Blood Loss: 842 mL   Total IV Fluids: 2500 ml LR  Urine Output: 100CC OF clear urine  Specimens: Cord gas (venous only since arteries collapsed/ could not see due to thin  cord), cord blood, placenta   Complications: no complications  Disposition: PACU - hemodynamically stable.   Maternal Condition: stable   Baby condition / location:  Couplet care / Skin to Skin  Attending Attestation: I performed the procedure.   Signed: Surgeon(s): Azucena Fallen, MD

## 2017-07-27 NOTE — Anesthesia Preprocedure Evaluation (Signed)
Anesthesia Evaluation  Patient identified by MRN, date of birth, ID band Patient awake    Reviewed: Allergy & Precautions, NPO status , Patient's Chart, lab work & pertinent test results  Airway Mallampati: II  TM Distance: >3 FB Neck ROM: Full    Dental  (+) Dental Advisory Given   Pulmonary asthma ,    Pulmonary exam normal breath sounds clear to auscultation       Cardiovascular negative cardio ROS Normal cardiovascular exam Rhythm:Regular Rate:Normal     Neuro/Psych negative neurological ROS  negative psych ROS   GI/Hepatic negative GI ROS, Neg liver ROS,   Endo/Other  Hypothyroidism Obesity  Renal/GU negative Renal ROS  negative genitourinary   Musculoskeletal negative musculoskeletal ROS (+)   Abdominal   Peds  Hematology negative hematology ROS (+)   Anesthesia Other Findings   Reproductive/Obstetrics (+) Pregnancy                             Anesthesia Physical Anesthesia Plan  ASA: II and emergent  Anesthesia Plan: Spinal   Post-op Pain Management:    Induction:   PONV Risk Score and Plan: Treatment may vary due to age or medical condition  Airway Management Planned: Natural Airway and Nasal Cannula  Additional Equipment: None  Intra-op Plan:   Post-operative Plan:   Informed Consent: I have reviewed the patients History and Physical, chart, labs and discussed the procedure including the risks, benefits and alternatives for the proposed anesthesia with the patient or authorized representative who has indicated his/her understanding and acceptance.   Dental advisory given  Plan Discussed with: CRNA and Surgeon  Anesthesia Plan Comments:         Anesthesia Quick Evaluation

## 2017-07-28 ENCOUNTER — Encounter (HOSPITAL_COMMUNITY): Payer: Self-pay | Admitting: Obstetrics & Gynecology

## 2017-07-28 LAB — CBC
HEMATOCRIT: 33.5 % — AB (ref 36.0–46.0)
Hemoglobin: 11.4 g/dL — ABNORMAL LOW (ref 12.0–15.0)
MCH: 28.9 pg (ref 26.0–34.0)
MCHC: 34 g/dL (ref 30.0–36.0)
MCV: 85 fL (ref 78.0–100.0)
Platelets: 179 10*3/uL (ref 150–400)
RBC: 3.94 MIL/uL (ref 3.87–5.11)
RDW: 14.8 % (ref 11.5–15.5)
WBC: 17.2 10*3/uL — ABNORMAL HIGH (ref 4.0–10.5)

## 2017-07-28 LAB — RPR: RPR Ser Ql: NONREACTIVE

## 2017-07-28 NOTE — Progress Notes (Signed)
Postpartum Day 1: Primary urgent cesarean Delivery for fetal distress remote from delivery. BOY, breast feeding  Subjective: Doing well, not voided since foley was removed at 11 am. Pain controlled. Lochia minimal.  Breast feeding attempts going well   Objective: Vital signs in last 24 hours: Temp:  [98.1 F (36.7 C)-99.4 F (37.4 C)] 99.4 F (37.4 C) (01/17 0519) Pulse Rate:  [50-89] 50 (01/17 0620) Resp:  [14-20] 20 (01/17 0519) BP: (103-131)/(53-79) 105/53 (01/17 0519) SpO2:  [95 %-100 %] 96 % (01/17 0620) Weight:  [202 lb 9.6 oz (91.9 kg)] 202 lb 9.6 oz (91.9 kg) (01/16 1413)  Physical Exam:  General: alert  CV RRR Lung CTA bilat Lochia: appropriate Uterine Fundus: firm Incision: healing well, no significant drainage DVT Evaluation: No evidence of DVT seen on physical exam.  CBC Latest Ref Rng & Units 07/28/2017 07/27/2017  WBC 4.0 - 10.5 K/uL 17.2(H) 11.0(H)  Hemoglobin 12.0 - 15.0 g/dL 11.4(L) 12.2  Hematocrit 36.0 - 46.0 % 33.5(L) 36.5  Platelets 150 - 400 K/uL 179 181   O(+) Rub Immune  Assessment/Plan: Status post Cesarean section. Doing well postoperatively.  Continue current care. Hypothyroidism-- continue Synthroid  Post-op and PP care routine  Elveria Royals 07/28/2017, 7:10 AM

## 2017-07-28 NOTE — Lactation Note (Signed)
This note was copied from a baby's chart. Lactation Consultation Note  Patient Name: Lydia Moss [id number] Date: 07/28/2017 Reason for consult: Initial assessment;Difficult latch;Term;[facility] Breastfeeding consultation services and support information left with mom.  RN requesting assist with latch.  Baby tends to thrust nipple out with tongue.  Mom has flat nipples but breast is compressible.  Drop of colostrum hand expressed.  Suck training done on gloved finger and baby bites and thrusts tongue.  Repeated attempts made at breast with and without nipple shield.  Baby did feed off and on for 10 minutes but needed relatched often.  After working with mom and baby for 30 minutes baby swaddled and encouraged mom to rest.  Parents will watch for feeding cues and call for assist prn.  Maternal Data Has patient been taught Hand Expression?: Yes Does the patient have breastfeeding experience prior to this delivery?: No  Feeding Feeding Type: Breast Fed Length of feed: 10 min  LATCH Score Latch: Repeated attempts needed to sustain latch, nipple held in mouth throughout feeding, stimulation needed to elicit sucking reflex.  Audible Swallowing: None  Type of Nipple: Flat  Comfort (Breast/Nipple): Soft / non-tender  Hold (Positioning): Assistance needed to correctly position infant at breast and maintain latch.  LATCH Score: 5  Interventions Interventions: Breast feeding basics reviewed;Assisted with latch;Skin to skin;Breast compression;Adjust position;Breast massage;Support pillows;Hand express  Lactation Tools Discussed/Used Tools: Nipple Shields Nipple shield size: 20   Consult Status Consult Status: Follow-up Date: 07/29/17 Follow-up type: In-patient    Ave Filter 07/28/2017, 3:26 PM

## 2017-07-29 ENCOUNTER — Other Ambulatory Visit: Payer: Self-pay

## 2017-07-29 NOTE — Lactation Note (Signed)
This note was copied from a baby's chart. Lactation Consultation Note  Patient Name: Lydia Moss [id number] Date: 07/29/2017 Reason for consult: Follow-up assessment;Mother's request;1st time breastfeeding;Infant < 6lbs;Term   Follow up with mom at mom and RN request. Mom was tearful that she was not getting large volumes of milk in the pump, discussed with mom that this is normal. Enc mom to BF infant with each feeding, follow with pumping and then follow with hand expression. Mom was just finishing pumping and obtained 2 cc of colostrum that was fed to infant in a curved tip syringe.   Infant was cueing to feed and asking to eat. Mom's breast are very soft and unable to express more colostrum. Mom was given some formula to syringe feed infant. Advised mom that they can increase infant supplement with this feeding. Infant was taking curved tip syringe slowly and needed frequent burping.   Mom was asking about taking Fenugreek. Discussed supply and demand and milk coming to volume. Reviewed need for frequent BF, pumping and breast emptying to encourage milk volume to increase. Discussed that Fenugreek is generally not started in the hospital.   Mom and family voice no further questions/concerns at this time.    Maternal Data Has patient been taught Hand Expression?: Yes Does the patient have breastfeeding experience prior to this delivery?: No  Feeding Feeding Type: Formula  LATCH Score                   Interventions    Lactation Tools Discussed/Used Pump Review: Setup, frequency, and cleaning;Milk Storage Initiated by:: Reviewed and encouraged every 2-3 hours after BF   Consult Status Consult Status: Follow-up Date: 07/30/17 Follow-up type: In-patient    Lydia Moss 07/29/2017, 9:32 PM

## 2017-07-29 NOTE — Progress Notes (Signed)
PPD#2: Primary urgent /section, fetal distress remote from delivery. BOY, breast feeding  Subjective: Doing well, pain control options reviewed. Tolerating diet, ambulating and voiding, flatus + Breast feeding attempts going well   Objective: Vital signs in last 24 hours: Temp:  [98.4 F (36.9 C)-98.6 F (37 C)] 98.4 F (36.9 C) (01/18 0526) Pulse Rate:  [59-64] 64 (01/18 0526) Resp:  [18-19] 18 (01/18 0526) BP: (97-112)/(55-59) 97/55 (01/18 0526) SpO2:  [99 %-100 %] 100 % (01/18 0526)  Physical Exam:  General: alert  CV RRR Lung CTA bilat Lochia: appropriate Uterine Fundus: firm Incision: healing well, no significant drainage DVT Evaluation: No evidence of DVT seen on physical exam.  CBC Latest Ref Rng & Units 07/28/2017 07/27/2017  WBC 4.0 - 10.5 K/uL 17.2(H) 11.0(H)  Hemoglobin 12.0 - 15.0 g/dL 11.4(L) 12.2  Hematocrit 36.0 - 46.0 % 33.5(L) 36.5  Platelets 150 - 400 K/uL 179 181   O(+) Rub Immune  Assessment/Plan: Status post Cesarean section. Doing well postoperatively.  Continue current care. Hypothyroidism-- continue Synthroid  Post-op and PP care routine Anticipate D/c tomorrow   Lydia Moss 07/29/2017, 5:09 PM

## 2017-07-29 NOTE — Lactation Note (Addendum)
This note was copied from a baby's chart. Lactation Consultation Note  Patient Name: Lydia Moss [id number] Date: 07/29/2017 Reason for consult: Follow-up assessment   P1, Baby 61 hours old.  < 6 lbs. Mother has flat nipples.  Had her hand express and prepump before attempting to latch. Mother compresses her breast close to nipple while trying to latch baby. Nipple inverts when mother does this. Instructed mother to support breast further back. Was able to off and on latch baby. Applied #20NS and baby was able to sustain latch for a few more minutes. Encouraged mother to post pump after each feeding until he is able to sustain latch for longer. Discussed with RN if after next pumping session, baby may need to be supplemented with formula. Reviewed finger syringe feeding but may need help with first time.     Maternal Data Has patient been taught Hand Expression?: Yes  Feeding Feeding Type: Breast Fed Length of feed: 15 min(off and on/difficulty)  LATCH Score Latch: Repeated attempts needed to sustain latch, nipple held in mouth throughout feeding, stimulation needed to elicit sucking reflex.  Audible Swallowing: A few with stimulation  Type of Nipple: Flat  Comfort (Breast/Nipple): Soft / non-tender  Hold (Positioning): Assistance needed to correctly position infant at breast and maintain latch.  LATCH Score: 6  Interventions    Lactation Tools Discussed/Used Tools: Pump;Nipple Shields Nipple shield size: 20 Pump Review: Setup, frequency, and cleaning;Milk Storage Initiated by:: Ivin Poot, RN [phone] Date initiated:: 07/29/17   Consult Status Consult Status: Follow-up Date: 07/30/17 Follow-up type: In-patient    [name] [facility] 07/29/2017, 2:47 PM

## 2017-07-29 NOTE — Lactation Note (Signed)
This note was copied from a baby's chart. Lactation Consultation Note RN reported that baby is sleepy and not wanting to BF. Baby had been BF well but now is sleepy, has missed several feedings. Asked RN to put baby STS, hand express and spoon feed. RN stated that is what she has been doing for the past several feeding times. TCB WDL per RN.  Hand expressed some colostrum and applied to lips. Baby took colostrum ok but sleepy. Mom's breast tender, soft, noted knot to Rt. outter lower breast. Mom stated knot benign. Noted puffy area to axillary bilaterally, asked mom if she had that prior to pregnancy, mom stated no. Discussed mom could have milk glands, to keep watch for getting full w/milk.  Discussed w/RN baby should want to BF by morning or report. Baby had been spitty a couple of times after delivery, none noted lately. Encouraged mom to pump for stimulation and hand expression.  Patient Name: Boy Nitara Szczerba EOFHQ'R Date: 07/29/2017 Reason for consult: Follow-up assessment;Difficult latch   Maternal Data    Feeding Feeding Type: Breast Fed Length of feed: 0 min(sleepy, no latch achieved)  LATCH Score Latch: Too sleepy or reluctant, no latch achieved, no sucking elicited.     Type of Nipple: Flat  Comfort (Breast/Nipple): Soft / non-tender        Interventions Interventions: Breast feeding basics reviewed;Assisted with latch;Breast compression;Skin to skin;Adjust position;Breast massage;Support pillows;Hand express;Position options;Expressed milk  Lactation Tools Discussed/Used     Consult Status Consult Status: Follow-up Date: 07/29/17 Follow-up type: In-patient    Aseret Hoffman, [place]ta [place] 07/29/2017, 3:22 AM

## 2017-07-30 ENCOUNTER — Encounter (HOSPITAL_COMMUNITY): Payer: Self-pay

## 2017-07-30 DIAGNOSIS — E039 Hypothyroidism, unspecified: Secondary | ICD-10-CM | POA: Diagnosis present

## 2017-07-30 MED ORDER — GUAIFENESIN-DM 100-10 MG/5ML PO SYRP: 10.0000 mL | ORAL_SOLUTION | Freq: Four times a day (QID) | ORAL | 0 refills | Status: AC | PRN

## 2017-07-30 MED ORDER — COCONUT OIL OIL: 1.0000 "application " | TOPICAL_OIL | 0 refills | Status: AC | PRN

## 2017-07-30 MED ORDER — SIMETHICONE 80 MG PO CHEW: 80.0000 mg | CHEWABLE_TABLET | Freq: Three times a day (TID) | ORAL | 0 refills | Status: AC

## 2017-07-30 MED ORDER — SENNOSIDES-DOCUSATE SODIUM 8.6-50 MG PO TABS: 2.0000 | ORAL_TABLET | ORAL | Status: AC

## 2017-07-30 MED ORDER — ACETAMINOPHEN 325 MG PO TABS: 650.0000 mg | ORAL_TABLET | ORAL | Status: AC | PRN

## 2017-07-30 MED ORDER — IBUPROFEN 600 MG PO TABS: 600.0000 mg | ORAL_TABLET | Freq: Four times a day (QID) | ORAL | 0 refills | Status: AC

## 2017-07-30 MED ORDER — OXYCODONE HCL 5 MG PO TABS: 5.0000 mg | ORAL_TABLET | ORAL | 0 refills | Status: AC | PRN

## 2017-07-30 NOTE — Lactation Note (Signed)
This note was copied from a baby's chart. Lactation Consultation Note  Patient Name: Lydia Moss [id number] Date: 07/30/2017 Reason for consult: Follow-up assessment;Infant < 6lbs;Infant weight loss   LC returned to room before discharge. Mother had not pumped since that last feeding.  Stressed the importance of pumping with DEBP to help establish her milk supply at least 4-6 times per day. Provided written feeding instructions. Mom encouraged to feed baby 8-12 times/24 hours and with feeding cues at least q 3 hours. Wake if needed. Discussed slow flow nipple bottle if baby does not latch at the breast or becomes frustrated after 10 min. Reviewed volume guidelines increasing per day of life. Reviewed engorgement care and monitoring voids/stools. Encouraged family to rent pump until their insurance pump arrives. Provided mother with #20NS and extra 5 french.     Maternal Data Has patient been taught Hand Expression?: Yes  Feeding Feeding Type: Breast Fed Length of feed: 25 min  LATCH Score Latch: Repeated attempts needed to sustain latch, nipple held in mouth throughout feeding, stimulation needed to elicit sucking reflex.  Audible Swallowing: A few with stimulation  Type of Nipple: Flat  Comfort (Breast/Nipple): Soft / non-tender  Hold (Positioning): Assistance needed to correctly position infant at breast and maintain latch.  LATCH Score: 6  Interventions Interventions: Breast feeding basics reviewed;Assisted with latch;Skin to skin;Hand express;Pre-pump if needed;Breast compression  Lactation Tools Discussed/Used Tools: Nipple Shields;1F feeding tube / Syringe Nipple shield size: 20 Breast pump type: Manual;Double-Electric Breast Pump   Consult Status Consult Status: Follow-up Date: 07/31/17 Follow-up type: In-patient    [name] [facility] 07/30/2017, 2:48 PM

## 2017-07-30 NOTE — Discharge Summary (Signed)
OB Discharge Summary     Patient Name: Lydia Moss DOB: 1986/01/06 MRN: [id number]  Date of admission: 07/27/2017 Delivering MD: MODY, VAISHALI   Date of discharge: 07/30/2017  Admitting diagnosis: 2ND DOSE OF BETAMETHASONE Intrauterine pregnancy: [redacted]w[redacted]d     Secondary diagnosis:  Principal Problem:   Cesarean delivery delivered - indication NRFHT - 1/16 Active Problems:   Indication for care in labor or delivery - IOL at 39 wks, labor induction for oligohydramnios BPP 6/8.    Postpartum care following cesarean delivery (1/16)   Hypothyroidism     Discharge diagnosis: Term Pregnancy Delivered and hypothyroidism, upper respiratory infection    Complications: None  Hospital course:  Induction of Labor With Cesarean Section  32 y.o. yo G1P1001 at [redacted]w[redacted]d was admitted to the hospital 07/27/2017 for induction of labor. Patient had a labor course significant for undilated cervix, fetal intolerance to contraction, repetitive late decelerations, category III FHT, remote from delivery.   . The patient went for cesarean section due to Non-Reassuring FHR, and delivered a Viable infant,@BABYSUPPRESS (DBLINK,ept,110,,1,,) Membrane Rupture Time/Date: )  ,07/27/2017   Details of operation can be found in separate operative Note.  Patient had an uncomplicated postpartum course. She is ambulating, tolerating a regular diet, passing flatus, and urinating well.  Patient is discharged home in stable condition on 07/30/17.                                    Physical exam  Vitals:   07/28/17 2100 07/29/17 0526 07/29/17 1835 07/30/17 0530  BP: (!) 112/59 (!) 97/55 109/77 (!) 104/57  Pulse: (!) 59 64 71 67  Resp: 19 18 18 18   Temp: 98.6 F (37 C) 98.4 F (36.9 C) 98.5 F (36.9 C) 98.3 F (36.8 C)  TempSrc: Oral Oral Oral Oral  SpO2: 99% 100%    Weight:      Height:       General: alert, cooperative and no distress Lochia: appropriate Uterine Fundus: firm Incision: Healing well with no significant  drainage, Dressing is clean, dry, and intact DVT Evaluation: No cords or calf tenderness. No significant calf/ankle edema. Labs: Lab Results  Component Value Date   WBC 17.2 (H) 07/28/2017   HGB 11.4 (L) 07/28/2017   HCT 33.5 (L) 07/28/2017   MCV 85.0 07/28/2017   PLT 179 07/28/2017   No flowsheet data found.  Discharge instruction: per After Visit Summary and "Baby and Me Booklet".  After visit meds:  Allergies as of 07/30/2017   No Known Allergies     Medication List    STOP taking these medications   doxylamine (Sleep) 25 MG tablet Commonly known as:  UNISOM     TAKE these medications   acetaminophen 325 MG tablet Commonly known as:  TYLENOL Take 2 tablets (650 mg total) by mouth every 4 (four) hours as needed (for pain scale < 4).   amoxicillin-clavulanate 875-125 MG tablet Commonly known as:  AUGMENTIN Take 1 tablet by mouth 2 (two) times daily.   coconut oil Oil Apply 1 application topically as needed.   famotidine 20 MG tablet Commonly known as:  PEPCID Take 20 mg by mouth daily as needed for heartburn or indigestion.   ferrous sulfate 325 (65 FE) MG tablet Take 325 mg by mouth daily with breakfast.   guaiFENesin-dextromethorphan 100-10 MG/5ML syrup Commonly known as:  ROBITUSSIN DM Take 10 mLs by mouth every 6 (six) hours  as needed for cough (allergies).   ibuprofen 600 MG tablet Commonly known as:  ADVIL,MOTRIN Take 1 tablet (600 mg total) by mouth every 6 (six) hours.   levothyroxine 50 MCG tablet Commonly known as:  SYNTHROID, LEVOTHROID Take 50 mcg by mouth daily before breakfast.   oxyCODONE 5 MG immediate release tablet Commonly known as:  Oxy IR/ROXICODONE Take 1 tablet (5 mg total) by mouth every 4 (four) hours as needed (pain scale 4-7).   prenatal multivitamin Tabs tablet Take 1 tablet by mouth daily at 12 noon.   senna-docusate 8.6-50 MG tablet Commonly known as:  Senokot-S Take 2 tablets by mouth daily. Start taking on:   07/31/2017   simethicone 80 MG chewable tablet Commonly known as:  MYLICON Chew 1 tablet (80 mg total) by mouth 3 (three) times daily after meals.       Diet: routine diet  Activity: Advance as tolerated. Pelvic rest for 6 weeks.   Outpatient follow up:6 weeks Follow up Appt:No future appointments. Follow up Visit:No Follow-up on file.  Postpartum contraception: Not Discussed  Newborn Data: Live born female [place] Birth Weight: 6 lb 3.5 oz (2820 g) APGAR: 8, 9  Newborn Delivery   Birth date/time:  07/27/2017 20:36:00 Delivery type:  C-Section, Low Transverse C-section categorization:  Primary     Baby Feeding: Breast Disposition:rooming in   07/30/2017 Juliene Pina, CNM

## 2017-07-30 NOTE — Lactation Note (Addendum)
This note was copied from a baby's chart. Lactation Consultation Note  Patient Name: Lydia Moss BMWUX'L Date: 07/30/2017 Reason for consult: Follow-up assessment;Infant < 6lbs;Infant weight loss   Baby 30 hours old < 6 lbs and still having difficulty with breastfeeding. Mother has flat nipples and she needs lots of direction to position hold her breast and baby. Had mother sit in bed.  Positioned pillows around mother and recommend she support breast further back and not pull tissue away from infant's nose. Attempted latching with and without #20NS.  Baby was able to sustain latch with #20NS and 5 french feeding tube with formula. Baby breastfed for approx 25 min off and on and received 23 ml total as he was latched in cross cradle hold on both breasts. Frequent reminders to mother of how to position her breast, allow it to fall naturally and then support.  Bring baby deep on NS and sustain.   Hold head at base. Reviewed how to clean 5 french feeding tube and volume guidelines increasing per day of life. Suggest mother post pump after every other feeding.  She does not own a DEBP.  Discussed rental in store and calling insurance. Mom encouraged to feed baby 8-12 times/24 hours and with feeding cues at least q 3 hours.  Put in message for OP appt. In case baby is discharged because he will need follow up.        Maternal Data Has patient been taught Hand Expression?: Yes  Feeding Feeding Type: Breast Fed Length of feed: 25 min  LATCH Score Latch: Repeated attempts needed to sustain latch, nipple held in mouth throughout feeding, stimulation needed to elicit sucking reflex.  Audible Swallowing: A few with stimulation  Type of Nipple: Flat  Comfort (Breast/Nipple): Soft / non-tender  Hold (Positioning): Assistance needed to correctly position infant at breast and maintain latch.  LATCH Score: 6  Interventions Interventions: Breast feeding basics reviewed;Assisted with  latch;Skin to skin;Hand express;Pre-pump if needed;Breast compression  Lactation Tools Discussed/Used Tools: Nipple Shields;16F feeding tube / Syringe Nipple shield size: 20 Breast pump type: Manual;Double-Electric Breast Pump   Consult Status Consult Status: Follow-up Date: 07/31/17 Follow-up type: In-patient    [name] [facility] 07/30/2017, 1:12 PM

## 2017-08-29 DIAGNOSIS — Z1151 Encounter for screening for human papillomavirus (HPV): Secondary | ICD-10-CM | POA: Diagnosis not present

## 2017-08-29 DIAGNOSIS — Z124 Encounter for screening for malignant neoplasm of cervix: Secondary | ICD-10-CM | POA: Diagnosis not present

## 2017-08-29 DIAGNOSIS — E039 Hypothyroidism, unspecified: Secondary | ICD-10-CM | POA: Diagnosis not present

## 2018-01-20 DIAGNOSIS — E039 Hypothyroidism, unspecified: Secondary | ICD-10-CM | POA: Diagnosis not present

## 2018-01-20 DIAGNOSIS — D649 Anemia, unspecified: Secondary | ICD-10-CM | POA: Diagnosis not present

## 2018-01-20 DIAGNOSIS — E7800 Pure hypercholesterolemia, unspecified: Secondary | ICD-10-CM | POA: Diagnosis not present

## 2018-01-20 DIAGNOSIS — E669 Obesity, unspecified: Secondary | ICD-10-CM | POA: Diagnosis not present

## 2018-02-27 DIAGNOSIS — D649 Anemia, unspecified: Secondary | ICD-10-CM | POA: Diagnosis not present

## 2018-02-27 DIAGNOSIS — Z13220 Encounter for screening for lipoid disorders: Secondary | ICD-10-CM | POA: Diagnosis not present

## 2018-02-27 DIAGNOSIS — Z0000 Encounter for general adult medical examination without abnormal findings: Secondary | ICD-10-CM | POA: Diagnosis not present

## 2018-02-27 DIAGNOSIS — Z131 Encounter for screening for diabetes mellitus: Secondary | ICD-10-CM | POA: Diagnosis not present

## 2018-05-31 DIAGNOSIS — R05 Cough: Secondary | ICD-10-CM | POA: Diagnosis not present

## 2018-06-15 DIAGNOSIS — J209 Acute bronchitis, unspecified: Secondary | ICD-10-CM | POA: Diagnosis not present

## 2018-06-15 DIAGNOSIS — J302 Other seasonal allergic rhinitis: Secondary | ICD-10-CM | POA: Diagnosis not present

## 2018-07-14 DIAGNOSIS — J40 Bronchitis, not specified as acute or chronic: Secondary | ICD-10-CM | POA: Diagnosis not present

## 2018-07-14 DIAGNOSIS — E7800 Pure hypercholesterolemia, unspecified: Secondary | ICD-10-CM | POA: Diagnosis not present

## 2018-09-13 DIAGNOSIS — R05 Cough: Secondary | ICD-10-CM | POA: Diagnosis not present

## 2018-09-13 DIAGNOSIS — J029 Acute pharyngitis, unspecified: Secondary | ICD-10-CM | POA: Diagnosis not present

## 2018-09-13 DIAGNOSIS — Z6833 Body mass index (BMI) 33.0-33.9, adult: Secondary | ICD-10-CM | POA: Diagnosis not present

## 2019-03-12 DIAGNOSIS — E7800 Pure hypercholesterolemia, unspecified: Secondary | ICD-10-CM | POA: Diagnosis not present

## 2019-03-26 DIAGNOSIS — Z23 Encounter for immunization: Secondary | ICD-10-CM | POA: Diagnosis not present

## 2019-03-26 DIAGNOSIS — D649 Anemia, unspecified: Secondary | ICD-10-CM | POA: Diagnosis not present

## 2019-03-26 DIAGNOSIS — E039 Hypothyroidism, unspecified: Secondary | ICD-10-CM | POA: Diagnosis not present

## 2019-03-26 DIAGNOSIS — Z131 Encounter for screening for diabetes mellitus: Secondary | ICD-10-CM | POA: Diagnosis not present

## 2019-03-26 DIAGNOSIS — Z0000 Encounter for general adult medical examination without abnormal findings: Secondary | ICD-10-CM | POA: Diagnosis not present

## 2019-10-16 DIAGNOSIS — E039 Hypothyroidism, unspecified: Secondary | ICD-10-CM | POA: Diagnosis not present

## 2019-10-16 DIAGNOSIS — E559 Vitamin D deficiency, unspecified: Secondary | ICD-10-CM | POA: Diagnosis not present

## 2019-10-16 DIAGNOSIS — D649 Anemia, unspecified: Secondary | ICD-10-CM | POA: Diagnosis not present

## 2019-10-24 ENCOUNTER — Emergency Department (HOSPITAL_COMMUNITY)
Admission: EM | Admit: 2019-10-24 | Discharge: 2019-10-24 | Disposition: A | Discharge status: Home / Self Care | Payer: BC Managed Care – PPO | Attending: Emergency Medicine | Admitting: Emergency Medicine

## 2019-10-24 ENCOUNTER — Emergency Department (HOSPITAL_COMMUNITY): Payer: BC Managed Care – PPO

## 2019-10-24 ENCOUNTER — Other Ambulatory Visit: Payer: Self-pay

## 2019-10-24 ENCOUNTER — Encounter (HOSPITAL_COMMUNITY): Payer: Self-pay | Admitting: Emergency Medicine

## 2019-10-24 DIAGNOSIS — E039 Hypothyroidism, unspecified: Secondary | ICD-10-CM | POA: Insufficient documentation

## 2019-10-24 DIAGNOSIS — Y999 Unspecified external cause status: Secondary | ICD-10-CM | POA: Insufficient documentation

## 2019-10-24 DIAGNOSIS — G44309 Post-traumatic headache, unspecified, not intractable: Secondary | ICD-10-CM | POA: Diagnosis not present

## 2019-10-24 DIAGNOSIS — Y92410 Unspecified street and highway as the place of occurrence of the external cause: Secondary | ICD-10-CM | POA: Insufficient documentation

## 2019-10-24 DIAGNOSIS — R42 Dizziness and giddiness: Secondary | ICD-10-CM | POA: Diagnosis not present

## 2019-10-24 DIAGNOSIS — R1032 Left lower quadrant pain: Secondary | ICD-10-CM | POA: Insufficient documentation

## 2019-10-24 DIAGNOSIS — R079 Chest pain, unspecified: Secondary | ICD-10-CM | POA: Diagnosis not present

## 2019-10-24 DIAGNOSIS — S81012A Laceration without foreign body, left knee, initial encounter: Secondary | ICD-10-CM | POA: Insufficient documentation

## 2019-10-24 DIAGNOSIS — Y939 Activity, unspecified: Secondary | ICD-10-CM | POA: Diagnosis not present

## 2019-10-24 DIAGNOSIS — S59901A Unspecified injury of right elbow, initial encounter: Secondary | ICD-10-CM | POA: Diagnosis not present

## 2019-10-24 DIAGNOSIS — S6991XA Unspecified injury of right wrist, hand and finger(s), initial encounter: Secondary | ICD-10-CM | POA: Diagnosis not present

## 2019-10-24 DIAGNOSIS — S81011A Laceration without foreign body, right knee, initial encounter: Secondary | ICD-10-CM

## 2019-10-24 DIAGNOSIS — R55 Syncope and collapse: Secondary | ICD-10-CM | POA: Diagnosis not present

## 2019-10-24 DIAGNOSIS — T68XXXA Hypothermia, initial encounter: Secondary | ICD-10-CM | POA: Diagnosis not present

## 2019-10-24 DIAGNOSIS — S8991XA Unspecified injury of right lower leg, initial encounter: Secondary | ICD-10-CM | POA: Diagnosis not present

## 2019-10-24 DIAGNOSIS — M25521 Pain in right elbow: Secondary | ICD-10-CM | POA: Diagnosis not present

## 2019-10-24 DIAGNOSIS — R52 Pain, unspecified: Secondary | ICD-10-CM | POA: Diagnosis not present

## 2019-10-24 DIAGNOSIS — M7918 Myalgia, other site: Secondary | ICD-10-CM

## 2019-10-24 DIAGNOSIS — S199XXA Unspecified injury of neck, initial encounter: Secondary | ICD-10-CM | POA: Diagnosis not present

## 2019-10-24 DIAGNOSIS — S3993XA Unspecified injury of pelvis, initial encounter: Secondary | ICD-10-CM | POA: Diagnosis not present

## 2019-10-24 DIAGNOSIS — S3991XA Unspecified injury of abdomen, initial encounter: Secondary | ICD-10-CM | POA: Diagnosis not present

## 2019-10-24 DIAGNOSIS — Z79899 Other long term (current) drug therapy: Secondary | ICD-10-CM | POA: Diagnosis not present

## 2019-10-24 DIAGNOSIS — S20312A Abrasion of left front wall of thorax, initial encounter: Secondary | ICD-10-CM | POA: Diagnosis not present

## 2019-10-24 DIAGNOSIS — M25531 Pain in right wrist: Secondary | ICD-10-CM | POA: Diagnosis not present

## 2019-10-24 DIAGNOSIS — S299XXA Unspecified injury of thorax, initial encounter: Secondary | ICD-10-CM | POA: Diagnosis not present

## 2019-10-24 DIAGNOSIS — M25539 Pain in unspecified wrist: Secondary | ICD-10-CM | POA: Diagnosis not present

## 2019-10-24 LAB — I-STAT CHEM 8, ED
BUN: 12 mg/dL (ref 6–20)
Calcium, Ion: 1.2 mmol/L (ref 1.15–1.40)
Chloride: 108 mmol/L (ref 98–111)
Creatinine, Ser: 0.7 mg/dL (ref 0.44–1.00)
Glucose, Bld: 171 mg/dL — ABNORMAL HIGH (ref 70–99)
HCT: 30 % — ABNORMAL LOW (ref 36.0–46.0)
Hemoglobin: 10.2 g/dL — ABNORMAL LOW (ref 12.0–15.0)
Potassium: 3.6 mmol/L (ref 3.5–5.1)
Sodium: 141 mmol/L (ref 135–145)
TCO2: 25 mmol/L (ref 22–32)

## 2019-10-24 LAB — COMPREHENSIVE METABOLIC PANEL
ALT: 12 U/L (ref 0–44)
AST: 20 U/L (ref 15–41)
Albumin: 3.4 g/dL — ABNORMAL LOW (ref 3.5–5.0)
Alkaline Phosphatase: 55 U/L (ref 38–126)
Anion gap: 8 (ref 5–15)
BUN: 10 mg/dL (ref 6–20)
CO2: 23 mmol/L (ref 22–32)
Calcium: 8.7 mg/dL — ABNORMAL LOW (ref 8.9–10.3)
Chloride: 107 mmol/L (ref 98–111)
Creatinine, Ser: 0.87 mg/dL (ref 0.44–1.00)
GFR calc Af Amer: >60 mL/min (ref >60)
GFR calc non Af Amer: >60 mL/min (ref >60)
Glucose, Bld: 127 mg/dL — ABNORMAL HIGH (ref 70–99)
Potassium: 3.5 mmol/L (ref 3.5–5.1)
Sodium: 138 mmol/L (ref 135–145)
Total Bilirubin: 0.3 mg/dL (ref 0.3–1.2)
Total Protein: 6.3 g/dL — ABNORMAL LOW (ref 6.5–8.1)

## 2019-10-24 LAB — PROTIME-INR
INR: 1.1 (ref 0.8–1.2)
Prothrombin Time: 13.6 seconds (ref 11.4–15.2)

## 2019-10-24 LAB — URINALYSIS, ROUTINE W REFLEX MICROSCOPIC
Bilirubin Urine: NEGATIVE
Glucose, UA: NEGATIVE mg/dL
Hgb urine dipstick: NEGATIVE
Ketones, ur: NEGATIVE mg/dL
Leukocytes,Ua: NEGATIVE
Nitrite: NEGATIVE
Protein, ur: NEGATIVE mg/dL
Specific Gravity, Urine: 1.02 (ref 1.005–1.030)
pH: 7 (ref 5.0–8.0)

## 2019-10-24 LAB — CBC
HCT: 33.1 % — ABNORMAL LOW (ref 36.0–46.0)
Hemoglobin: 10.4 g/dL — ABNORMAL LOW (ref 12.0–15.0)
MCH: 27.2 pg (ref 26.0–34.0)
MCHC: 31.4 g/dL (ref 30.0–36.0)
MCV: 86.4 fL (ref 80.0–100.0)
Platelets: 223 10*3/uL (ref 150–400)
RBC: 3.83 MIL/uL — ABNORMAL LOW (ref 3.87–5.11)
RDW: 13.1 % (ref 11.5–15.5)
WBC: 11.9 10*3/uL — ABNORMAL HIGH (ref 4.0–10.5)
nRBC: 0 % (ref 0.0–0.2)

## 2019-10-24 LAB — I-STAT BETA HCG BLOOD, ED (MC, WL, AP ONLY): I-stat hCG, quantitative: <5 m[IU]/mL (ref <5)

## 2019-10-24 LAB — LACTIC ACID, PLASMA: Lactic Acid, Venous: 1.3 mmol/L (ref 0.5–1.9)

## 2019-10-24 LAB — SAMPLE TO BLOOD BANK

## 2019-10-24 MED ORDER — LIDOCAINE HCL 2 % IJ SOLN
10.0000 mL | Freq: Once | INTRAMUSCULAR | Status: DC
  Filled 2019-10-24: qty 20

## 2019-10-24 MED ORDER — SODIUM CHLORIDE 0.9 % IV BOLUS
1000.0000 mL | Freq: Once | INTRAVENOUS | Status: AC
  Administered 2019-10-24: 1000 mL via INTRAVENOUS

## 2019-10-24 MED ORDER — METHOCARBAMOL 750 MG PO TABS: 750.0000 mg | ORAL_TABLET | Freq: Every evening | ORAL | 0 refills | Status: DC | PRN

## 2019-10-24 MED ORDER — ONDANSETRON HCL 4 MG PO TABS: 4.0000 mg | ORAL_TABLET | Freq: Four times a day (QID) | ORAL | 0 refills | Status: AC

## 2019-10-24 MED ORDER — HYDROCODONE-ACETAMINOPHEN 5-325 MG PO TABS
1.0000 | ORAL_TABLET | Freq: Once | ORAL | Status: DC | PRN
  Filled 2019-10-24: qty 1

## 2019-10-24 MED ORDER — IOHEXOL 300 MG/ML  SOLN
100.0000 mL | Freq: Once | INTRAMUSCULAR | Status: AC | PRN
  Administered 2019-10-24: 100 mL via INTRAVENOUS

## 2019-10-24 MED ORDER — METHOCARBAMOL 750 MG PO TABS: 750.0000 mg | ORAL_TABLET | Freq: Every evening | ORAL | 0 refills | Status: AC | PRN

## 2019-10-24 MED ORDER — ACETAMINOPHEN 325 MG PO TABS
650.0000 mg | ORAL_TABLET | Freq: Once | ORAL | Status: AC
  Administered 2019-10-24: 650 mg via ORAL

## 2019-10-24 MED ORDER — ONDANSETRON 4 MG PO TBDP
4.0000 mg | ORAL_TABLET | Freq: Once | ORAL | Status: AC
  Administered 2019-10-24: 4 mg via ORAL
  Filled 2019-10-24: qty 1

## 2019-10-24 MED ORDER — FENTANYL CITRATE (PF) 100 MCG/2ML IJ SOLN
50.0000 ug | Freq: Once | INTRAMUSCULAR | Status: AC | PRN
  Administered 2019-10-24: 50 ug via INTRAVENOUS
  Filled 2019-10-24: qty 2

## 2019-10-24 MED ORDER — CEFAZOLIN SODIUM-DEXTROSE 1-4 GM/50ML-% IV SOLN
1.0000 g | Freq: Once | INTRAVENOUS | Status: AC
  Administered 2019-10-24: 1 g via INTRAVENOUS
  Filled 2019-10-24: qty 50

## 2019-10-24 NOTE — ED Notes (Signed)
PTS CLOTHES AND JEWELRY REMOVED.

## 2019-10-24 NOTE — Progress Notes (Signed)
Orthopedic Tech Progress Note Patient Details:  Lydia Moss May 26, 1986 [id number]  Patient ID: Lydia Moss, female   DOB: [birth year], 34 y.o.   MRN: [id number] rn requested I help pt with walking with crutches.  Karolee Stamps 10/24/2019, 8:48 PM
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
## 2019-10-24 NOTE — Discharge Instructions (Signed)
Please follow-up for suture removal at either urgent care ,the emergency department, or your primary care doctor in 10-14 days.  You may use the knee immobilizer and crutches for comfort. If you continue to have knee pain after 5-7 days you can follow up with the orthopedic doctor that was provided on your discharge paperwork.   You may alternate taking Tylenol and Ibuprofen as needed for pain control. You may take 400-600 mg of ibuprofen every 6 hours and [phone] mg of Tylenol every 6 hours. Do not exceed 4000 mg of Tylenol daily as this can lead to liver damage. Also, make sure to take Ibuprofen with meals as it can cause an upset stomach. Do not take other NSAIDs while taking Ibuprofen such as (Aleve, Naprosyn, Aspirin, Celebrex, etc) and do not take more than the prescribed dose as this can lead to ulcers and bleeding in your GI tract. You may use warm and cold compresses to help with your symptoms.   Please follow up with your primary doctor within the next 7-10 days for re-evaluation and further treatment of your symptoms.   You were given a prescription for Robaxin which is a muscle relaxer.  You should not drive, work, or operate machinery while taking this medication as it can make you very drowsy.    Please return to the emergency room immediately if you experience any new or worsening symptoms or any symptoms that indicate worsening infection such as fevers, increased redness/swelling/pain, warmth, or drainage from the affected area.

## 2019-10-24 NOTE — ED Notes (Signed)
PT LAST ATE AND DRANK AT 1400.

## 2019-10-24 NOTE — Progress Notes (Signed)
Orthopedic Tech Progress Note Patient Details:  Lydia Moss 12-16-1985 [id number]  Ortho Devices Type of Ortho Device: Crutches, Knee Immobilizer Ortho Device/Splint Location: RLE Ortho Device/Splint Interventions: Adjustment   Post Interventions Patient Tolerated: Well Instructions Provided: Adjustment of device, Care of device   Lydia Moss [name] 10/24/2019, 7:20 PM

## 2019-10-26 DIAGNOSIS — T07XXXA Unspecified multiple injuries, initial encounter: Secondary | ICD-10-CM | POA: Diagnosis not present

## 2019-10-26 DIAGNOSIS — S81011A Laceration without foreign body, right knee, initial encounter: Secondary | ICD-10-CM | POA: Diagnosis not present

## 2019-10-26 DIAGNOSIS — M533 Sacrococcygeal disorders, not elsewhere classified: Secondary | ICD-10-CM | POA: Diagnosis not present

## 2019-11-02 DIAGNOSIS — S81011D Laceration without foreign body, right knee, subsequent encounter: Secondary | ICD-10-CM | POA: Diagnosis not present

## 2019-11-02 DIAGNOSIS — T07XXXD Unspecified multiple injuries, subsequent encounter: Secondary | ICD-10-CM | POA: Diagnosis not present

## 2019-11-02 DIAGNOSIS — Z4802 Encounter for removal of sutures: Secondary | ICD-10-CM | POA: Diagnosis not present

## 2019-11-02 DIAGNOSIS — M533 Sacrococcygeal disorders, not elsewhere classified: Secondary | ICD-10-CM | POA: Diagnosis not present

## 2019-11-06 DIAGNOSIS — Z4802 Encounter for removal of sutures: Secondary | ICD-10-CM | POA: Diagnosis not present

## 2019-11-06 DIAGNOSIS — T07XXXD Unspecified multiple injuries, subsequent encounter: Secondary | ICD-10-CM | POA: Diagnosis not present

## 2019-11-06 DIAGNOSIS — S81011D Laceration without foreign body, right knee, subsequent encounter: Secondary | ICD-10-CM | POA: Diagnosis not present

## 2019-11-06 DIAGNOSIS — M533 Sacrococcygeal disorders, not elsewhere classified: Secondary | ICD-10-CM | POA: Diagnosis not present

## 2019-11-22 DIAGNOSIS — M25561 Pain in right knee: Secondary | ICD-10-CM | POA: Diagnosis not present

## 2019-11-22 DIAGNOSIS — M25511 Pain in right shoulder: Secondary | ICD-10-CM | POA: Diagnosis not present

## 2019-12-21 DIAGNOSIS — D649 Anemia, unspecified: Secondary | ICD-10-CM | POA: Diagnosis not present

## 2019-12-21 DIAGNOSIS — E7800 Pure hypercholesterolemia, unspecified: Secondary | ICD-10-CM | POA: Diagnosis not present

## 2019-12-21 DIAGNOSIS — E559 Vitamin D deficiency, unspecified: Secondary | ICD-10-CM | POA: Diagnosis not present

## 2019-12-21 DIAGNOSIS — E039 Hypothyroidism, unspecified: Secondary | ICD-10-CM | POA: Diagnosis not present

## 2020-02-04 DIAGNOSIS — B009 Herpesviral infection, unspecified: Secondary | ICD-10-CM | POA: Diagnosis not present

## 2020-02-04 DIAGNOSIS — J069 Acute upper respiratory infection, unspecified: Secondary | ICD-10-CM | POA: Diagnosis not present

## 2020-03-28 DIAGNOSIS — E559 Vitamin D deficiency, unspecified: Secondary | ICD-10-CM | POA: Diagnosis not present

## 2020-03-28 DIAGNOSIS — D649 Anemia, unspecified: Secondary | ICD-10-CM | POA: Diagnosis not present

## 2020-03-28 DIAGNOSIS — Z23 Encounter for immunization: Secondary | ICD-10-CM | POA: Diagnosis not present

## 2020-03-28 DIAGNOSIS — Z0000 Encounter for general adult medical examination without abnormal findings: Secondary | ICD-10-CM | POA: Diagnosis not present

## 2020-07-10 DIAGNOSIS — E7800 Pure hypercholesterolemia, unspecified: Secondary | ICD-10-CM | POA: Diagnosis not present

## 2020-07-10 DIAGNOSIS — E559 Vitamin D deficiency, unspecified: Secondary | ICD-10-CM | POA: Diagnosis not present

## 2020-07-10 DIAGNOSIS — D649 Anemia, unspecified: Secondary | ICD-10-CM | POA: Diagnosis not present

## 2020-07-21 DIAGNOSIS — Z03818 Encounter for observation for suspected exposure to other biological agents ruled out: Secondary | ICD-10-CM | POA: Diagnosis not present

## 2020-07-30 DIAGNOSIS — U071 COVID-19: Secondary | ICD-10-CM | POA: Diagnosis not present

## 2020-08-02 DIAGNOSIS — Z20822 Contact with and (suspected) exposure to covid-19: Secondary | ICD-10-CM | POA: Diagnosis not present

## 2020-09-01 DIAGNOSIS — R5383 Other fatigue: Secondary | ICD-10-CM | POA: Diagnosis not present

## 2020-09-01 DIAGNOSIS — E559 Vitamin D deficiency, unspecified: Secondary | ICD-10-CM | POA: Diagnosis not present

## 2020-09-01 DIAGNOSIS — M25571 Pain in right ankle and joints of right foot: Secondary | ICD-10-CM | POA: Diagnosis not present

## 2020-11-18 DIAGNOSIS — M25571 Pain in right ankle and joints of right foot: Secondary | ICD-10-CM | POA: Diagnosis not present

## 2020-12-12 DIAGNOSIS — J019 Acute sinusitis, unspecified: Secondary | ICD-10-CM | POA: Diagnosis not present

## 2020-12-12 DIAGNOSIS — R059 Cough, unspecified: Secondary | ICD-10-CM | POA: Diagnosis not present

## 2020-12-24 DIAGNOSIS — Z20822 Contact with and (suspected) exposure to covid-19: Secondary | ICD-10-CM | POA: Diagnosis not present

## 2021-01-02 DIAGNOSIS — M2141 Flat foot [pes planus] (acquired), right foot: Secondary | ICD-10-CM | POA: Diagnosis not present

## 2021-01-02 DIAGNOSIS — M25571 Pain in right ankle and joints of right foot: Secondary | ICD-10-CM | POA: Diagnosis not present

## 2021-01-02 DIAGNOSIS — M2142 Flat foot [pes planus] (acquired), left foot: Secondary | ICD-10-CM | POA: Diagnosis not present

## 2021-02-03 DIAGNOSIS — R059 Cough, unspecified: Secondary | ICD-10-CM | POA: Diagnosis not present

## 2021-02-03 DIAGNOSIS — J209 Acute bronchitis, unspecified: Secondary | ICD-10-CM | POA: Diagnosis not present

## 2021-02-03 DIAGNOSIS — R0981 Nasal congestion: Secondary | ICD-10-CM | POA: Diagnosis not present

## 2021-04-06 DIAGNOSIS — Z23 Encounter for immunization: Secondary | ICD-10-CM | POA: Diagnosis not present

## 2021-04-06 DIAGNOSIS — Z0000 Encounter for general adult medical examination without abnormal findings: Secondary | ICD-10-CM | POA: Diagnosis not present

## 2021-04-06 DIAGNOSIS — D649 Anemia, unspecified: Secondary | ICD-10-CM | POA: Diagnosis not present

## 2021-04-06 DIAGNOSIS — E039 Hypothyroidism, unspecified: Secondary | ICD-10-CM | POA: Diagnosis not present

## 2021-04-06 DIAGNOSIS — E669 Obesity, unspecified: Secondary | ICD-10-CM | POA: Diagnosis not present

## 2021-04-06 DIAGNOSIS — E7800 Pure hypercholesterolemia, unspecified: Secondary | ICD-10-CM | POA: Diagnosis not present

## 2021-04-06 DIAGNOSIS — E559 Vitamin D deficiency, unspecified: Secondary | ICD-10-CM | POA: Diagnosis not present

## 2021-06-26 DIAGNOSIS — H9193 Unspecified hearing loss, bilateral: Secondary | ICD-10-CM | POA: Diagnosis not present

## 2021-06-26 DIAGNOSIS — H8110 Benign paroxysmal vertigo, unspecified ear: Secondary | ICD-10-CM | POA: Diagnosis not present
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
## 2021-10-10 DIAGNOSIS — R079 Chest pain, unspecified: Secondary | ICD-10-CM | POA: Diagnosis not present

## 2021-10-10 DIAGNOSIS — J069 Acute upper respiratory infection, unspecified: Secondary | ICD-10-CM | POA: Diagnosis not present

## 2021-11-17 DIAGNOSIS — M25562 Pain in left knee: Secondary | ICD-10-CM | POA: Diagnosis not present

## 2022-07-06 DIAGNOSIS — Z0000 Encounter for general adult medical examination without abnormal findings: Secondary | ICD-10-CM | POA: Diagnosis not present

## 2022-07-06 DIAGNOSIS — E559 Vitamin D deficiency, unspecified: Secondary | ICD-10-CM | POA: Diagnosis not present

## 2022-07-06 DIAGNOSIS — E039 Hypothyroidism, unspecified: Secondary | ICD-10-CM | POA: Diagnosis not present

## 2022-07-06 DIAGNOSIS — E7800 Pure hypercholesterolemia, unspecified: Secondary | ICD-10-CM | POA: Diagnosis not present

## 2022-07-06 DIAGNOSIS — Z23 Encounter for immunization: Secondary | ICD-10-CM | POA: Diagnosis not present

## 2022-07-06 DIAGNOSIS — D649 Anemia, unspecified: Secondary | ICD-10-CM | POA: Diagnosis not present

## 2022-08-19 DIAGNOSIS — D649 Anemia, unspecified: Secondary | ICD-10-CM | POA: Diagnosis not present

## 2022-08-19 DIAGNOSIS — R7303 Prediabetes: Secondary | ICD-10-CM | POA: Diagnosis not present

## 2022-11-15 DIAGNOSIS — M25562 Pain in left knee: Secondary | ICD-10-CM | POA: Diagnosis not present

## 2022-11-16 DIAGNOSIS — M25562 Pain in left knee: Secondary | ICD-10-CM | POA: Diagnosis not present

## 2022-11-26 DIAGNOSIS — Z03818 Encounter for observation for suspected exposure to other biological agents ruled out: Secondary | ICD-10-CM | POA: Diagnosis not present

## 2022-11-26 DIAGNOSIS — R0981 Nasal congestion: Secondary | ICD-10-CM | POA: Diagnosis not present

## 2022-11-26 DIAGNOSIS — R509 Fever, unspecified: Secondary | ICD-10-CM | POA: Diagnosis not present

## 2023-04-22 DIAGNOSIS — E039 Hypothyroidism, unspecified: Secondary | ICD-10-CM | POA: Diagnosis not present

## 2023-04-22 DIAGNOSIS — E559 Vitamin D deficiency, unspecified: Secondary | ICD-10-CM | POA: Diagnosis not present

## 2023-04-22 DIAGNOSIS — E7800 Pure hypercholesterolemia, unspecified: Secondary | ICD-10-CM | POA: Diagnosis not present

## 2023-04-22 DIAGNOSIS — D649 Anemia, unspecified: Secondary | ICD-10-CM | POA: Diagnosis not present

## 2023-07-14 DIAGNOSIS — Z124 Encounter for screening for malignant neoplasm of cervix: Secondary | ICD-10-CM | POA: Diagnosis not present

## 2023-07-14 DIAGNOSIS — Z1331 Encounter for screening for depression: Secondary | ICD-10-CM | POA: Diagnosis not present

## 2023-07-14 DIAGNOSIS — Z01419 Encounter for gynecological examination (general) (routine) without abnormal findings: Secondary | ICD-10-CM | POA: Diagnosis not present

## 2023-07-14 DIAGNOSIS — R87810 Cervical high risk human papillomavirus (HPV) DNA test positive: Secondary | ICD-10-CM | POA: Diagnosis not present

## 2023-07-29 DIAGNOSIS — R92333 Mammographic heterogeneous density, bilateral breasts: Secondary | ICD-10-CM | POA: Diagnosis not present

## 2023-07-29 DIAGNOSIS — N6313 Unspecified lump in the right breast, lower outer quadrant: Secondary | ICD-10-CM | POA: Diagnosis not present

## 2023-07-29 DIAGNOSIS — R92331 Mammographic heterogeneous density, right breast: Secondary | ICD-10-CM | POA: Diagnosis not present

## 2023-08-02 DIAGNOSIS — Z3202 Encounter for pregnancy test, result negative: Secondary | ICD-10-CM | POA: Diagnosis not present

## 2023-08-02 DIAGNOSIS — R87612 Low grade squamous intraepithelial lesion on cytologic smear of cervix (LGSIL): Secondary | ICD-10-CM | POA: Diagnosis not present

## 2023-08-09 DIAGNOSIS — Z0000 Encounter for general adult medical examination without abnormal findings: Secondary | ICD-10-CM | POA: Diagnosis not present

## 2023-08-09 DIAGNOSIS — D649 Anemia, unspecified: Secondary | ICD-10-CM | POA: Diagnosis not present

## 2023-08-09 DIAGNOSIS — E039 Hypothyroidism, unspecified: Secondary | ICD-10-CM | POA: Diagnosis not present

## 2023-08-09 DIAGNOSIS — E7800 Pure hypercholesterolemia, unspecified: Secondary | ICD-10-CM | POA: Diagnosis not present

## 2023-08-09 DIAGNOSIS — E559 Vitamin D deficiency, unspecified: Secondary | ICD-10-CM | POA: Diagnosis not present

## 2023-08-09 DIAGNOSIS — Z23 Encounter for immunization: Secondary | ICD-10-CM | POA: Diagnosis not present

## 2023-08-26 DIAGNOSIS — Z23 Encounter for immunization: Secondary | ICD-10-CM | POA: Diagnosis not present

## 2023-09-23 DIAGNOSIS — N898 Other specified noninflammatory disorders of vagina: Secondary | ICD-10-CM | POA: Diagnosis not present

## 2023-09-23 DIAGNOSIS — N762 Acute vulvitis: Secondary | ICD-10-CM | POA: Diagnosis not present

## 2023-09-23 DIAGNOSIS — R102 Pelvic and perineal pain: Secondary | ICD-10-CM | POA: Diagnosis not present

## 2023-09-23 DIAGNOSIS — B3731 Acute candidiasis of vulva and vagina: Secondary | ICD-10-CM | POA: Diagnosis not present

## 2023-10-25 DIAGNOSIS — Z23 Encounter for immunization: Secondary | ICD-10-CM | POA: Diagnosis not present

## 2024-02-02 DIAGNOSIS — M25561 Pain in right knee: Secondary | ICD-10-CM | POA: Diagnosis not present

## 2024-02-02 DIAGNOSIS — Z6830 Body mass index (BMI) 30.0-30.9, adult: Secondary | ICD-10-CM | POA: Diagnosis not present

## 2024-02-02 DIAGNOSIS — R1031 Right lower quadrant pain: Secondary | ICD-10-CM | POA: Diagnosis not present

## 2024-02-10 DIAGNOSIS — N6313 Unspecified lump in the right breast, lower outer quadrant: Secondary | ICD-10-CM | POA: Diagnosis not present

## 2024-02-22 DIAGNOSIS — M25562 Pain in left knee: Secondary | ICD-10-CM | POA: Diagnosis not present

## 2024-02-28 DIAGNOSIS — Z23 Encounter for immunization: Secondary | ICD-10-CM | POA: Diagnosis not present

## 2024-03-05 DIAGNOSIS — N760 Acute vaginitis: Secondary | ICD-10-CM | POA: Diagnosis not present

## 2024-03-05 DIAGNOSIS — N870 Mild cervical dysplasia: Secondary | ICD-10-CM | POA: Diagnosis not present

## 2024-03-05 DIAGNOSIS — B3731 Acute candidiasis of vulva and vagina: Secondary | ICD-10-CM | POA: Diagnosis not present

## 2024-03-05 DIAGNOSIS — R87810 Cervical high risk human papillomavirus (HPV) DNA test positive: Secondary | ICD-10-CM | POA: Diagnosis not present

## 2024-03-27 DIAGNOSIS — M6281 Muscle weakness (generalized): Secondary | ICD-10-CM | POA: Diagnosis not present

## 2024-03-27 DIAGNOSIS — M25662 Stiffness of left knee, not elsewhere classified: Secondary | ICD-10-CM | POA: Diagnosis not present

## 2024-03-27 DIAGNOSIS — M25562 Pain in left knee: Secondary | ICD-10-CM | POA: Diagnosis not present

## 2024-03-29 DIAGNOSIS — M25662 Stiffness of left knee, not elsewhere classified: Secondary | ICD-10-CM | POA: Diagnosis not present

## 2024-03-29 DIAGNOSIS — M6281 Muscle weakness (generalized): Secondary | ICD-10-CM | POA: Diagnosis not present

## 2024-03-29 DIAGNOSIS — M25562 Pain in left knee: Secondary | ICD-10-CM | POA: Diagnosis not present

## 2024-04-02 ENCOUNTER — Other Ambulatory Visit (HOSPITAL_BASED_OUTPATIENT_CLINIC_OR_DEPARTMENT_OTHER): Payer: Self-pay | Admitting: Sports Medicine

## 2024-04-02 DIAGNOSIS — M25562 Pain in left knee: Secondary | ICD-10-CM

## 2024-04-03 DIAGNOSIS — M25662 Stiffness of left knee, not elsewhere classified: Secondary | ICD-10-CM | POA: Diagnosis not present

## 2024-04-03 DIAGNOSIS — M25562 Pain in left knee: Secondary | ICD-10-CM | POA: Diagnosis not present

## 2024-04-03 DIAGNOSIS — M6281 Muscle weakness (generalized): Secondary | ICD-10-CM | POA: Diagnosis not present

## 2024-04-06 DIAGNOSIS — M25562 Pain in left knee: Secondary | ICD-10-CM | POA: Diagnosis not present

## 2024-04-06 DIAGNOSIS — M6281 Muscle weakness (generalized): Secondary | ICD-10-CM | POA: Diagnosis not present

## 2024-04-06 DIAGNOSIS — M25662 Stiffness of left knee, not elsewhere classified: Secondary | ICD-10-CM | POA: Diagnosis not present

## 2024-04-08 ENCOUNTER — Ambulatory Visit (HOSPITAL_BASED_OUTPATIENT_CLINIC_OR_DEPARTMENT_OTHER)
Admission: RE | Admit: 2024-04-08 | Discharge: 2024-04-08 | Disposition: A | Source: Ambulatory Visit | Attending: Sports Medicine | Admitting: Sports Medicine

## 2024-04-08 DIAGNOSIS — M94262 Chondromalacia, left knee: Secondary | ICD-10-CM | POA: Diagnosis not present

## 2024-04-08 DIAGNOSIS — M1712 Unilateral primary osteoarthritis, left knee: Secondary | ICD-10-CM | POA: Diagnosis not present

## 2024-04-08 DIAGNOSIS — M25562 Pain in left knee: Secondary | ICD-10-CM | POA: Diagnosis not present

## 2024-04-10 DIAGNOSIS — M25562 Pain in left knee: Secondary | ICD-10-CM | POA: Diagnosis not present

## 2024-04-10 DIAGNOSIS — M25662 Stiffness of left knee, not elsewhere classified: Secondary | ICD-10-CM | POA: Diagnosis not present

## 2024-04-10 DIAGNOSIS — M6281 Muscle weakness (generalized): Secondary | ICD-10-CM | POA: Diagnosis not present

## 2024-04-12 DIAGNOSIS — M6281 Muscle weakness (generalized): Secondary | ICD-10-CM | POA: Diagnosis not present

## 2024-04-12 DIAGNOSIS — M25562 Pain in left knee: Secondary | ICD-10-CM | POA: Diagnosis not present

## 2024-04-12 DIAGNOSIS — M25662 Stiffness of left knee, not elsewhere classified: Secondary | ICD-10-CM | POA: Diagnosis not present

## 2024-04-17 DIAGNOSIS — M6281 Muscle weakness (generalized): Secondary | ICD-10-CM | POA: Diagnosis not present

## 2024-04-17 DIAGNOSIS — M25662 Stiffness of left knee, not elsewhere classified: Secondary | ICD-10-CM | POA: Diagnosis not present

## 2024-04-17 DIAGNOSIS — M25562 Pain in left knee: Secondary | ICD-10-CM | POA: Diagnosis not present

## 2024-04-19 DIAGNOSIS — M6281 Muscle weakness (generalized): Secondary | ICD-10-CM | POA: Diagnosis not present

## 2024-04-19 DIAGNOSIS — M25562 Pain in left knee: Secondary | ICD-10-CM | POA: Diagnosis not present

## 2024-04-19 DIAGNOSIS — M25662 Stiffness of left knee, not elsewhere classified: Secondary | ICD-10-CM | POA: Diagnosis not present

## 2024-04-24 DIAGNOSIS — M25662 Stiffness of left knee, not elsewhere classified: Secondary | ICD-10-CM | POA: Diagnosis not present

## 2024-04-24 DIAGNOSIS — M6281 Muscle weakness (generalized): Secondary | ICD-10-CM | POA: Diagnosis not present

## 2024-04-24 DIAGNOSIS — M25562 Pain in left knee: Secondary | ICD-10-CM | POA: Diagnosis not present

## 2024-04-26 DIAGNOSIS — M6281 Muscle weakness (generalized): Secondary | ICD-10-CM | POA: Diagnosis not present

## 2024-04-26 DIAGNOSIS — M25662 Stiffness of left knee, not elsewhere classified: Secondary | ICD-10-CM | POA: Diagnosis not present

## 2024-04-26 DIAGNOSIS — M25562 Pain in left knee: Secondary | ICD-10-CM | POA: Diagnosis not present

## 2024-05-07 DIAGNOSIS — M1712 Unilateral primary osteoarthritis, left knee: Secondary | ICD-10-CM | POA: Diagnosis not present
